# Patient Record
Sex: Female | Born: 1985 | Hispanic: No | Marital: Married | Smoking: Former smoker
Health system: Southern US, Community
[De-identification: ages and names within clinical notes are randomized; demographics above are authoritative.]

## PROBLEM LIST (undated history)

## (undated) DIAGNOSIS — M722 Plantar fascial fibromatosis: Secondary | ICD-10-CM

## (undated) DIAGNOSIS — D259 Leiomyoma of uterus, unspecified: Secondary | ICD-10-CM

## (undated) DIAGNOSIS — M25551 Pain in right hip: Secondary | ICD-10-CM

## (undated) DIAGNOSIS — M25562 Pain in left knee: Secondary | ICD-10-CM

## (undated) DIAGNOSIS — D649 Anemia, unspecified: Secondary | ICD-10-CM

## (undated) HISTORY — DX: Anemia, unspecified: D64.9

## (undated) HISTORY — PX: NO PAST SURGERIES: SHX2092

## (undated) HISTORY — DX: Pain in right hip: M25.551

## (undated) HISTORY — DX: Plantar fascial fibromatosis: M72.2

## (undated) HISTORY — DX: Pain in left knee: M25.562

---
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
## 2006-02-06 ENCOUNTER — Inpatient Hospital Stay (HOSPITAL_COMMUNITY): Admission: EM | Admit: 2006-02-06 | Discharge: 2006-02-08 | Payer: Self-pay | Admitting: Emergency Medicine

## 2009-05-31 ENCOUNTER — Emergency Department (HOSPITAL_COMMUNITY): Admission: EM | Admit: 2009-05-31 | Discharge: 2009-05-31 | Payer: Self-pay | Admitting: Emergency Medicine

## 2010-11-26 LAB — DIFFERENTIAL
Basophils Absolute: 0 10*3/uL (ref 0.0–0.1)
Basophils Relative: 0 % (ref 0–1)
Eosinophils Absolute: 0 10*3/uL (ref 0.0–0.7)
Eosinophils Relative: 0 % (ref 0–5)
Lymphocytes Relative: 21 % (ref 12–46)
Lymphs Abs: 1.4 10*3/uL (ref 0.7–4.0)
Monocytes Absolute: 0.8 10*3/uL (ref 0.1–1.0)
Monocytes Relative: 12 % (ref 3–12)
Neutro Abs: 4.2 10*3/uL (ref 1.7–7.7)
Neutrophils Relative %: 66 % (ref 43–77)

## 2010-11-26 LAB — CBC
HCT: 42.8 % (ref 36.0–46.0)
Hemoglobin: 14.2 g/dL (ref 12.0–15.0)
MCHC: 33.3 g/dL (ref 30.0–36.0)
MCV: 87.6 fL (ref 78.0–100.0)
Platelets: 177 10*3/uL (ref 150–400)
RBC: 4.89 MIL/uL (ref 3.87–5.11)
RDW: 13.2 % (ref 11.5–15.5)
WBC: 6.4 10*3/uL (ref 4.0–10.5)

## 2010-11-26 LAB — COMPREHENSIVE METABOLIC PANEL
ALT: 17 U/L (ref 0–35)
AST: 21 U/L (ref 0–37)
Albumin: 3.5 g/dL (ref 3.5–5.2)
Alkaline Phosphatase: 43 U/L (ref 39–117)
BUN: 17 mg/dL (ref 6–23)
CO2: 26 mEq/L (ref 19–32)
Calcium: 8.7 mg/dL (ref 8.4–10.5)
Chloride: 100 mEq/L (ref 96–112)
Creatinine, Ser: 1.02 mg/dL (ref 0.4–1.2)
GFR calc Af Amer: >60 mL/min (ref >60)
GFR calc non Af Amer: >60 mL/min (ref >60)
Glucose, Bld: 81 mg/dL (ref 70–99)
Potassium: 3.5 mEq/L (ref 3.5–5.1)
Sodium: 135 mEq/L (ref 135–145)
Total Bilirubin: 0.8 mg/dL (ref 0.3–1.2)
Total Protein: 8.8 g/dL — ABNORMAL HIGH (ref 6.0–8.3)

## 2010-11-26 LAB — RAPID STREP SCREEN (MED CTR MEBANE ONLY): Streptococcus, Group A Screen (Direct): NEGATIVE

## 2010-11-26 LAB — MONONUCLEOSIS SCREEN: Mono Screen: NEGATIVE

## 2011-01-08 NOTE — Discharge Summary (Signed)
NAMEMISHELLE, Kathleen NO.:  [id number]   MEDICAL RECORD NO.:  [id number]          PATIENT TYPE:  INP   LOCATION:  5508                         FACILITY:  [facility]   PHYSICIAN:  Jackie Plum, M.D.DATE OF BIRTH:  [birth year]   DATE OF ADMISSION:  02/06/2006  DATE OF DISCHARGE:  02/08/2006                                 DISCHARGE SUMMARY   DISCHARGE DIAGNOSES:  1. Viral syndrome.  2. Urticaria, possibly secondary to viral syndrome.  3. Nausea, vomiting and diarrhea secondary to viral syndrome, resolved.   DISCHARGE MEDICATIONS:  1. Pepcid 20 mg b.i.d.  2. Prednisone 20 mg b.i.d.  3. Benadryl as previously.   Patient was to followup with her PCP, Dr. Cliffton Asters, on the same day of  discharge.  Blood culture was pending and needed to be followed up.   Patient presented with a constellation of symptoms of fever, chills,  fatigue, myalgia with dyspnea, cough without sputum production, and  odynophagia.  She had some nausea and vomiting and diarrhea.  Patient was  seen and admitted.  Please see admission H&P dictated  on February 06, 2006, for  full details regarding patient's presentation.  She was placed on  antibiotics for presumed Pneumonia.  CT scan was done, which was negative  for a pneumonic process and antibiotics were discontinued.  Her hives  improved with steroids, Pepcid and Benadryl and was discharged in a stable  satisfactory condition by day number 3 of hospitalization.  Patient had  plans to see her PCP.  On the same day her group A strep was negative, and  so had been her urine culture C. diff as well as mono screen.   CONDITION ON DISCHARGE:  Stable and satisfactory condition.   CONSULTANTS:  Dr. Weldon Inches.      Jackie Plum, M.D.  Electronically Signed     GO/MEDQ  D:  03/24/2006  T:  03/24/2006  Job:  [id number]   cc:   Kathleen Joyce. Weldon Inches, MD

## 2011-01-08 NOTE — H&P (Signed)
NAME:  Kathleen Joyce, Kathleen Joyce               ACCOUNT NO.:  [id number]   MEDICAL RECORD NO.:  [id number]          PATIENT TYPE:  EMS   LOCATION:  [facility]                         FACILITY:  [facility]   PHYSICIAN:  Melissa L. Ladona Ridgel, MD  DATE OF BIRTH:  01-18-1986   DATE OF ADMISSION:  02/06/2006  DATE OF DISCHARGE:                                HISTORY & PHYSICAL   CHIEF COMPLAINT:  Hives and flu-like symptoms.   PRIMARY CARE PHYSICIAN:  Laurann Montana, M.D.   HISTORY OF PRESENT ILLNESS:  The patient is an 25 year old African-American  female who states that on Tuesday of last week she woke up in the morning  with flu-like symptoms. Her throat was sore and she had some hoarseness.  Later on in the day, she ate a bagel and thereafter, developed acute hives  all over her face, arms, back, as well as her legs. She went to see the  doctor and was given a cortisone shot and started on oral prednisone as well  as medications for nausea. After the cortisone shot around Friday, she  noticed that her heart was racing and she developed worsening symptoms of  shortness of breath, trouble swallowing secondary to pain, fever of 101.9  with chills. She started to have a cough with nausea and vomiting and  diarrhea. She had no sick contacts at home and is bringing up a yellow  sputum. Her mother brought her to the hospital today when she stated that  her chest was uncomfortable.   REVIEW OF SYSTEMS:  Positive diarrhea several times a day, decreased in  number today. Nausea with vomiting, decreased today. Some dysuria. The  patient had, as stated, contact with an everything bagel, which she has  never eaten before. Her mother relates that she never had any hives as a  child and no food allergies as a child.   PAST MEDICAL HISTORY:  No history of hives until this week. Her mother  relates that she had all of her childhood inoculations and recently, started  on her hepatitis series of inoculations in March for  college in the fall.  She had chicken pox as a child.   PAST SURGICAL HISTORY:  None.   ALLERGIES:  NO KNOWN DRUG ALLERGIES.   MEDICATIONS:  Promethazine and prednisone.   GYNECOLOGIC HISTORY:  Last menstrual period was 1 month ago. She states that  she is not sexually active.   PHYSICAL EXAMINATION:  VITAL SIGNS:  Temperature 101 when she arrived, down  to 100.9. Blood pressure 127/57, pulse 107/120. Saturation 93%.  GENERAL:  An ill appearing African-American female with dry skin and  evidence for resolving edema of her face and lips. She had a hoarse voice.  HEENT:  Normocephalic and atraumatic. Pupils are equal, round, and reactive  to light. Positive conjunctival infection bilaterally. Her TM's are clear.  Dry mucous membranes. Posterior pharynx is red with small petechiae on the  soft palate.  NECK:  Supple. There is no JVD and no lymph nodes. No carotid bruits. She  does have some tenderness in the left submandibular area but I do  not  palpate a huge lymph node there.  CHEST:  Decreased breath sounds bilaterally with no rhonchi, rales, or  wheezes.  CARDIOVASCULAR:  Tachycardiac, positive S1 and S2. No S3, S4, murmurs, rubs,  or gallops.  ABDOMEN:  Soft, nontender, and nondistended with positive bowel sounds.  There is no hepatosplenomegaly. She is slightly obese.  EXTREMITIES:  Fading hives on her back and face with some evidence for  residual edema.  NEUROLOGIC:  Alert, awake, and oriented times three. Cranial nerves 2-12 are  intact. Power is 5 over 5. Deep tendon reflexes are 2+. Plantar's are  downgoing.   LABORATORY DATA:  Trace leuko-esterase with 7 to 10 WBC's and white count of  7.8 with hemoglobin of 13.5, hematocrit 40.6, platelets of 177,000. Her  white cells shows an 83% neutrophilic shift. She has eosinophils of only 1%,  which is within normal limits. Sodium 136, potassium 3.3. Chloride is 100,  CO2 is 27, BUN is 12, creatinine 0.9. Hemoglobin is 110.  LFT's are within  normal limits. Her group A strep is negative. Blood cultures are pending.   Chest x-ray shows right greater than left pneumonia pattern.   ASSESSMENT/PLAN:  This is an 25 year old African-American female with  symptoms of hives that started after the symptoms of upper respiratory  infection last Tuesday. After receiving a Cortisone shot, the patient  progressed on to fever, nausea, vomiting, and diarrhea. She developed some  tachycardia and chest discomfort today with the finding of pneumonia  bilaterally on her chest x-ray.   #1.  CARDIOVASCULAR:  Tachycardia, likely multi-factorial with fever,  dehydration, and pneumonia. Will present telemetry to further monitor her.   #2.  PULMONARY:  Bilateral pneumonia, question bacterial versus viral. Will  treat her with antibiotics. Will check a monospot and rule out elevated EBV  titers. She will be placed on incentive spirometry and NSAID'S for her pain  and I will put her on a tapering dose of prednisone with an H2 blocker and  Benadryl.   #3.  GASTROINTESTINAL:  Nausea and vomiting. She will be started with Zofran  and clear liquids. Will treat her sore throat with Chloraseptic and I will  obtain stool cultures.   #4.  GENITOURINARY:  Possible urinary tract infection. Will send cultures.  The ceftriaxone at this time should cover her until we know the organism.   #5. ENDOCRINE:  No history of diabetes and her blood sugar is appropriate,  so I will put her on D5 normal with 20 of KCL. DVT prophylaxis will be with  __________ hose and ambulation.      Melissa L. Ladona Ridgel, MD     MLT/MEDQ  D:  02/06/2006  T:  02/07/2006  Job:  [id number]   cc:   [place]. Cliffton Asters, M.D.  Fax: [phone]

## 2015-06-06 ENCOUNTER — Encounter: Payer: Self-pay | Admitting: Obstetrics & Gynecology

## 2015-07-09 ENCOUNTER — Encounter: Payer: Self-pay | Admitting: Obstetrics & Gynecology

## 2018-05-03 ENCOUNTER — Encounter: Payer: Self-pay | Admitting: *Deleted

## 2018-05-05 ENCOUNTER — Ambulatory Visit: Payer: Self-pay | Admitting: Family Medicine

## 2018-05-05 DIAGNOSIS — Z0289 Encounter for other administrative examinations: Secondary | ICD-10-CM

## 2018-05-12 ENCOUNTER — Ambulatory Visit: Payer: 59 | Admitting: Family Medicine

## 2018-05-12 ENCOUNTER — Encounter: Payer: Self-pay | Admitting: Family Medicine

## 2018-05-12 VITALS — BP 136/60 | HR 61 | Temp 98.0°F | Resp 12 | Ht 70.0 in | Wt 276.2 lb

## 2018-05-12 DIAGNOSIS — Z131 Encounter for screening for diabetes mellitus: Secondary | ICD-10-CM | POA: Diagnosis not present

## 2018-05-12 DIAGNOSIS — M722 Plantar fascial fibromatosis: Secondary | ICD-10-CM

## 2018-05-12 DIAGNOSIS — N920 Excessive and frequent menstruation with regular cycle: Secondary | ICD-10-CM

## 2018-05-12 DIAGNOSIS — Z0000 Encounter for general adult medical examination without abnormal findings: Secondary | ICD-10-CM | POA: Diagnosis not present

## 2018-05-12 DIAGNOSIS — Z13220 Encounter for screening for lipoid disorders: Secondary | ICD-10-CM | POA: Diagnosis not present

## 2018-05-12 DIAGNOSIS — Z23 Encounter for immunization: Secondary | ICD-10-CM | POA: Diagnosis not present

## 2018-05-12 LAB — CBC
HCT: 37.3 % (ref 36.0–46.0)
Hemoglobin: 12.1 g/dL (ref 12.0–15.0)
MCHC: 32.4 g/dL (ref 30.0–36.0)
MCV: 81 fl (ref 78.0–100.0)
Platelets: 215 10*3/uL (ref 150.0–400.0)
RBC: 4.61 Mil/uL (ref 3.87–5.11)
RDW: 16.3 % — ABNORMAL HIGH (ref 11.5–15.5)
WBC: 6.3 10*3/uL (ref 4.0–10.5)

## 2018-05-12 LAB — BASIC METABOLIC PANEL
BUN: 14 mg/dL (ref 6–23)
CO2: 26 mEq/L (ref 19–32)
Calcium: 9.5 mg/dL (ref 8.4–10.5)
Chloride: 105 mEq/L (ref 96–112)
Creatinine, Ser: 0.89 mg/dL (ref 0.40–1.20)
GFR: 78.73 mL/min (ref >60.00)
Glucose, Bld: 91 mg/dL (ref 70–99)
Potassium: 4.2 mEq/L (ref 3.5–5.1)
Sodium: 138 mEq/L (ref 135–145)

## 2018-05-12 LAB — LIPID PANEL
Cholesterol: 177 mg/dL (ref 0–200)
HDL: 44.7 mg/dL (ref >39.00)
LDL Cholesterol: 118 mg/dL — ABNORMAL HIGH (ref 0–99)
NonHDL: 132.41
Total CHOL/HDL Ratio: 4
Triglycerides: 73 mg/dL (ref 0.0–149.0)
VLDL: 14.6 mg/dL (ref 0.0–40.0)

## 2018-05-12 NOTE — Progress Notes (Signed)
HPI:   [name] is a 32 y.o. female, who is here today to establish care.  Former PCP: She had a PCP in [place] Last preventive routine visit: About a year ago.   Chronic medical problems: Bilateral plantar fasciitis, she was following with podiatrist in [place]. She is also reporting history of "borderline anemia", due to heavy menses.  Denies history of diabetes, hypertension, hyperlipidemia.  Concerns today: She needs a biometric and complete physical. She is also requesting screening for lupus, her mother has Hx of systemic lupus.  Last pap smear in 2017. Denies Hx of abnormal pap smear. M: 12 G:0 She is not sexually active.  She is planning on establishing with gyn in this area.   She is also reporting some symptoms she had about 2 months ago,resolved and she attributed to stress. She developed hives,arthralgias and mild numbness on LE's. She had symptoms for a few days, no OTC treatment, symptoms resolved spontaneously. No fever,chills,sore throat, cough,dyspnea,chest pain,abdominal pain,N/V,abdominal pain,or skin rash.  She lives with her mother.   Since May this year she has been exercising regularly, 4 times per week, and following a healthy diet. LMP 04/29/18, Hx of heavy menses,sometimes with clots.    Review of Systems  Constitutional: Negative for appetite change, fatigue and fever.  HENT: Negative for dental problem, hearing loss, mouth sores, sore throat, trouble swallowing and voice change.   Eyes: Negative for redness and visual disturbance.  Respiratory: Negative for cough, shortness of breath and wheezing.   Cardiovascular: Negative for chest pain, palpitations and leg swelling.  Gastrointestinal: Negative for abdominal pain, nausea and vomiting.       No changes in bowel habits.  Endocrine: Negative for cold intolerance, heat intolerance, polydipsia, polyphagia and polyuria.  Genitourinary: Negative for decreased urine volume,  dysuria, hematuria, vaginal bleeding and vaginal discharge.  Musculoskeletal: Negative for gait problem, joint swelling and myalgias.  Skin: Negative for color change and rash.  Allergic/Immunologic: Negative for environmental allergies.  Neurological: Negative for syncope and weakness.  Hematological: Negative for adenopathy. Does not bruise/bleed easily.  Psychiatric/Behavioral: Negative for confusion and sleep disturbance. The patient is not nervous/anxious.   All other systems reviewed and are negative.   No current outpatient medications on file prior to visit.   No current facility-administered medications on file prior to visit.      Past Medical History:  Diagnosis Date  . Anemia   . Plantar fasciitis    Allergies  Allergen Reactions  . Banana     Family History  Problem Relation Age of Onset  . Lupus Mother   . Kidney disease Mother   . Diverticulitis Father   . Diabetes Maternal Aunt   . Cancer Maternal Aunt   . Cancer Maternal Uncle     Social History   Socioeconomic History  . Marital status: Single    Spouse name: Not on file  . Number of children: 0  . Years of education: Not on file  . Highest education level: Not on file  Occupational History  . Not on file  Social Needs  . Financial resource strain: Not on file  . Food insecurity:    Worry: Not on file    Inability: Not on file  . Transportation needs:    Medical: Not on file    Non-medical: Not on file  Tobacco Use  . Smoking status: Former Research scientist (life sciences)  . Smokeless tobacco: Never Used  Substance and Sexual Activity  .  Alcohol use: Yes  . Drug use: Not Currently  . Sexual activity: Not Currently  Lifestyle  . Physical activity:    Days per week: Not on file    Minutes per session: Not on file  . Stress: Not on file  Relationships  . Social connections:    Talks on phone: Not on file    Gets together: Not on file    Attends religious service: Not on file    Active member of club or  organization: Not on file    Attends meetings of clubs or organizations: Not on file    Relationship status: Not on file  Other Topics Concern  . Not on file  Social History Narrative  . Not on file    Vitals:   05/12/18 0813  BP: 136/60  Pulse: 61  Resp: 12  Temp: 98 F (36.7 C)  SpO2: 98%    Body mass index is 39.63 kg/m.   Physical Exam  Nursing note and vitals reviewed. Constitutional: She is oriented to person, place, and time. She appears well-developed. No distress.  HENT:  Head: Normocephalic and atraumatic.  Right Ear: Hearing, tympanic membrane, external ear and ear canal normal.  Left Ear: Hearing, tympanic membrane, external ear and ear canal normal.  Mouth/Throat: Uvula is midline, oropharynx is clear and moist and mucous membranes are normal.  Eyes: Pupils are equal, round, and reactive to light. Conjunctivae and EOM are normal.  Neck: No tracheal deviation present. No thyromegaly present.  Cardiovascular: Normal rate and regular rhythm.  No murmur heard. Pulses:      Dorsalis pedis pulses are 2+ on the right side, and 2+ on the left side.  Respiratory: Effort normal and breath sounds normal. No respiratory distress.  GI: Soft. She exhibits no mass. There is no hepatomegaly. There is no tenderness.  Genitourinary:  Genitourinary Comments: Deferred to gyn.  Musculoskeletal: She exhibits no edema.  No major deformity or signs of synovitis appreciated.  Lymphadenopathy:    She has no cervical adenopathy.       Right: No supraclavicular adenopathy present.       Left: No supraclavicular adenopathy present.  Neurological: She is alert and oriented to person, place, and time. She has normal strength. No cranial nerve deficit. Coordination and gait normal.  Reflex Scores:      Bicep reflexes are 2+ on the right side and 2+ on the left side.      Patellar reflexes are 2+ on the right side and 2+ on the left side. Skin: Skin is warm. No rash noted. No erythema.   Psychiatric: She has a normal mood and affect.  Well groomed, good eye contact.      ASSESSMENT AND PLAN:   Ms. Kathleen Joyce was seen today for establish care and annual exam.  Orders Placed This Encounter  Procedures  . Flu Vaccine QUAD 36+ mos IM  . Basic metabolic panel  . CBC  . Lipid panel   Lab Results  Component Value Date   WBC 6.3 05/12/2018   HGB 12.1 05/12/2018   HCT 37.3 05/12/2018   MCV 81.0 05/12/2018   PLT 215.0 05/12/2018   Lab Results  Component Value Date   CHOL 177 05/12/2018   HDL 44.70 05/12/2018   LDLCALC 118 (H) 05/12/2018   TRIG 73.0 05/12/2018   CHOLHDL 4 05/12/2018   Lab Results  Component Value Date   CREATININE 0.89 05/12/2018   BUN 14 05/12/2018   NA 138 05/12/2018  K 4.2 05/12/2018   CL 105 05/12/2018   CO2 26 05/12/2018     Routine general medical examination at a health care facility  We discussed the importance of regular physical activity and healthy diet for prevention of chronic illness and/or complications. Preventive guidelines reviewed. Vaccination reported as up to date. Influenza vaccine today. She will continue female preventive care with gyn. She will drop form off to be completed. Next CPE in a year.  Screening for lipid disorders -     Lipid panel  Diabetes mellitus screening -     Basic metabolic panel  Menorrhagia with regular cycle -     CBC  Plantar fasciitis, bilateral  She has tried different treatments,recommend establishing with podiatrist in the are. Night foot splint.  Need for immunization against influenza -     Flu Vaccine QUAD 36+ mos IM   In regard to her concerned for lupus "screening', educated Dx criteria and symptoms. Since she is not symptomatic ,I recommend holding on work up.    Kathleen Joyce G. [place], MD  [facility]. [place] office.

## 2018-05-12 NOTE — Patient Instructions (Addendum)
A few things to remember from today's visit:   Routine general medical examination at a health care facility  Screening for lipid disorders - Plan: Lipid panel  Diabetes mellitus screening - Plan: Basic metabolic panel  Menorrhagia with regular cycle - Plan: CBC  Plantar fasciitis, bilateral  Today you have you routine preventive visit.  Given a referral to establish with gynecologist or podiatrist.   At least 150 minutes of moderate exercise per week, daily brisk walking for 15-30 min is a good exercise option. Healthy diet low in saturated (animal) fats and sweets and consisting of fresh fruits and vegetables, lean meats such as fish and white chicken and whole grains.  These are some of recommendations for screening depending of age and risk factors:   - Vaccines:  Tdap vaccine every 10 years.  Shingles vaccine recommended at age 55, could be given after 31 years of age but not sure about insurance coverage.   Pneumonia vaccines:  Prevnar 13 at 65 and Pneumovax at 62. Sometimes Pneumovax is giving earlier if history of smoking, lung disease,diabetes,kidney disease among some.    Screening for diabetes at age 72 and every 3 years.  Cervical cancer prevention:  Pap smear starts at 31 years of age and continues periodically until 31 years old in low risk women. Pap smear every 3 years between 44 and 92 years old. Pap smear every 3-5 years between women 72 and older if pap smear negative and HPV screening negative.   -Breast cancer: Mammogram: There is disagreement between experts about when to start screening in low risk asymptomatic female but recent recommendations are to start screening at 15 and not later than 31 years old , every 1-2 years and after 31 yo q 2 years. Screening is recommended until 31 years old but some women can continue screening depending of healthy issues.   Colon cancer screening: starts at 31 years old until 31 years old.  Cholesterol disorder  screening at age 4 and every 3 years.  Also recommended:  1. Dental visit- Brush and floss your teeth twice daily; visit your dentist twice a year. 2. Eye doctor- Get an eye exam at least every 2 years. 3. Helmet use- Always wear a helmet when riding a bicycle, motorcycle, rollerblading or skateboarding. 4. Safe sex- If you may be exposed to sexually transmitted infections, use a condom. 5. Seat belts- Seat belts can save your live; always wear one. 6. Smoke/Carbon Monoxide detectors- These detectors need to be installed on the appropriate level of your home. Replace batteries at least once a year. 7. Skin cancer- When out in the sun please cover up and use sunscreen 15 SPF or higher. 8. Violence- If anyone is threatening or hurting you, please tell your healthcare provider.  9. Drink alcohol in moderation- Limit alcohol intake to one drink or less per day. Never drink and drive.   Please be sure medication list is accurate. If a new problem present, please set up appointment sooner than planned today.

## 2018-07-11 ENCOUNTER — Ambulatory Visit: Payer: Self-pay | Admitting: Gynecology

## 2018-07-13 ENCOUNTER — Encounter: Payer: Self-pay | Admitting: Obstetrics & Gynecology

## 2018-07-13 ENCOUNTER — Ambulatory Visit (INDEPENDENT_AMBULATORY_CARE_PROVIDER_SITE_OTHER): Payer: 59 | Admitting: Obstetrics & Gynecology

## 2018-07-13 ENCOUNTER — Telehealth: Payer: Self-pay | Admitting: *Deleted

## 2018-07-13 VITALS — BP 126/74 | Ht 70.0 in | Wt 274.0 lb

## 2018-07-13 DIAGNOSIS — E6609 Other obesity due to excess calories: Secondary | ICD-10-CM

## 2018-07-13 DIAGNOSIS — N6310 Unspecified lump in the right breast, unspecified quadrant: Secondary | ICD-10-CM

## 2018-07-13 DIAGNOSIS — Z6839 Body mass index (BMI) 39.0-39.9, adult: Secondary | ICD-10-CM

## 2018-07-13 DIAGNOSIS — Z113 Encounter for screening for infections with a predominantly sexual mode of transmission: Secondary | ICD-10-CM | POA: Diagnosis not present

## 2018-07-13 DIAGNOSIS — Z1151 Encounter for screening for human papillomavirus (HPV): Secondary | ICD-10-CM

## 2018-07-13 DIAGNOSIS — Z01419 Encounter for gynecological examination (general) (routine) without abnormal findings: Secondary | ICD-10-CM | POA: Diagnosis not present

## 2018-07-13 NOTE — Patient Instructions (Signed)
1. Encounter for routine gynecological examination with Papanicolaou smear of cervix Normal gynecologic exam.  Pap with high-risk HPV done today.  Left breast exam normal.  Right breast exam with a nodular/cystic pattern at 4 to 5 O'clock.  Fasting health labs with family physician.  2. Screen for STD (sexually transmitted disease) Currently abstinent.  Recommend strict condom use if becomes sexually active again. -Gonorrhea and chlamydia on Pap - HIV antibody (with reflex) - RPR - Hepatitis C Antibody - Hepatitis B Surface AntiGEN  3. Lump of right breast Probable fibrocystic breast disease at 4-5 o'clock on the right breast.  We will proceed with a right diagnostic mammogram and ultrasound.  Recommend to decrease caffeine products.  4. Class 2 obesity due to excess calories without serious comorbidity with body mass index (BMI) of 39.0 to 39.9 in adult Continue with low calorie/low carb diet.  [employer] suggested.  Recommend aerobic physical activity 5 times a week and weightlifting every 2 days.  Johnna, it was a pleasure meeting you today!  I will inform you of your results as soon as they are available.

## 2018-07-13 NOTE — Telephone Encounter (Signed)
Orders placed at the breast center they will call with time and date

## 2018-07-13 NOTE — Telephone Encounter (Signed)
-----   Message from Princess Bruins, MD sent at 07/13/2018  9:56 AM EST ----- Regarding: Refer for Rt Dx Mammo/US Rt breast lumps/tenderness at 4-5 O'clock on outside.

## 2018-07-13 NOTE — Progress Notes (Signed)
Kathleen Joyce [birth year] [id number]   History:    32 y.o. G0 Single  RP:  New patient presenting for annual gyn exam   HPI: Abstinent x >1 year.  Full STI screen/Pap test negative in 06/2015.  Loosing weight with a low calorie/carb diet and increased physical activity.  Per patient, lost >30 Lbs in the last year.  BMI 39.31 today.  Since loosing weight, her menstrual periods became regular normal every month.  No pelvic pain.  Normal vaginal secretions.  Lt breast normal.  Noticed tender lumps x a few weeks at the inner/lower Rt breast.  No first degree relative with Breast Ca.  Health Labs with Fam MD.  Past medical history,surgical history, family history and social history were all reviewed and documented in the EPIC chart.  Gynecologic History Patient's last menstrual period was 06/28/2018. Contraception: abstinence Last Pap: 06/2015. Results were: Negative Last mammogram: Never Bone Density: Never Colonoscopy: Never  Obstetric History OB History  Gravida Para Term Preterm AB Living  0 0 0 0 0 0  SAB TAB Ectopic Multiple Live Births  0 0 0 0 0     ROS: A ROS was performed and pertinent positives and negatives are included in the history.  GENERAL: No fevers or chills. HEENT: No change in vision, no earache, sore throat or sinus congestion. NECK: No pain or stiffness. CARDIOVASCULAR: No chest pain or pressure. No palpitations. PULMONARY: No shortness of breath, cough or wheeze. GASTROINTESTINAL: No abdominal pain, nausea, vomiting or diarrhea, melena or bright red blood per rectum. GENITOURINARY: No urinary frequency, urgency, hesitancy or dysuria. MUSCULOSKELETAL: No joint or muscle pain, no back pain, no recent trauma. DERMATOLOGIC: No rash, no itching, no lesions. ENDOCRINE: No polyuria, polydipsia, no heat or cold intolerance. No recent change in weight. HEMATOLOGICAL: No anemia or easy bruising or bleeding. NEUROLOGIC: No headache, seizures, numbness, tingling or weakness.  PSYCHIATRIC: No depression, no loss of interest in normal activity or change in sleep pattern.     Exam:   BP 126/74   Ht 5\' 10"  (1.778 m)   Wt 274 lb (124.3 kg)   LMP 06/28/2018 Comment: no birth control  BMI 39.31 kg/m   Body mass index is 39.31 kg/m.  General appearance : Well developed well nourished female. No acute distress HEENT: Eyes: no retinal hemorrhage or exudates,  Neck supple, trachea midline, no carotid bruits, no thyroidmegaly Lungs: Clear to auscultation, no rhonchi or wheezes, or rib retractions  Heart: Regular rate and rhythm, no murmurs or gallops Breast:Examined in sitting and supine position were symmetrical in appearance, no palpable masses or tenderness,  no skin retraction, no nipple inversion, no nipple discharge, no skin discoloration, no axillary or supraclavicular lymphadenopathy Abdomen: no palpable masses or tenderness, no rebound or guarding Extremities: no edema or skin discoloration or tenderness  Pelvic: Vulva: Normal             Vagina: No gross lesions or discharge  Cervix: No gross lesions or discharge.  Pap/HPV HR/Gono-Chlam done  Uterus  AV, normal size, shape and consistency, non-tender and mobile  Adnexa  Without masses or tenderness  Anus: Normal   Assessment/Plan:  32 y.o. female for annual exam   1. Encounter for routine gynecological examination with Papanicolaou smear of cervix Normal gynecologic exam.  Pap with high-risk HPV done today.  Left breast exam normal.  Right breast exam with a nodular/cystic pattern at 4 to 5 O'clock.  Fasting health labs with family physician.  2.  Screen for STD (sexually transmitted disease) Currently abstinent.  Recommend strict condom use if becomes sexually active again. -Gonorrhea and chlamydia on Pap - HIV antibody (with reflex) - RPR - Hepatitis C Antibody - Hepatitis B Surface AntiGEN  3. Lump of right breast Probable fibrocystic breast disease at 4-5 o'clock on the right breast.  We  will proceed with a right diagnostic mammogram and ultrasound.  Recommend to decrease caffeine products.  4. Class 2 obesity due to excess calories without serious comorbidity with body mass index (BMI) of 39.0 to 39.9 in adult Continue with low calorie/low carb diet.  [employer] suggested.  Recommend aerobic physical activity 5 times a week and weightlifting every 2 days.  Counseling on above issues and coordination of care more than 50% for 20 minutes.  Princess Bruins MD, 9:36 AM 07/13/2018

## 2018-07-13 NOTE — Addendum Note (Signed)
Addended by: Thurnell Garbe A on: 07/13/2018 11:21 AM   Modules accepted: Orders

## 2018-07-14 LAB — HIV ANTIBODY (ROUTINE TESTING W REFLEX): HIV 1&2 Ab, 4th Generation: NONREACTIVE

## 2018-07-14 LAB — RPR: RPR Ser Ql: NONREACTIVE

## 2018-07-14 LAB — HEPATITIS C ANTIBODY
Hepatitis C Ab: NONREACTIVE
SIGNAL TO CUT-OFF: 0.02 (ref <1.00)

## 2018-07-14 LAB — HEPATITIS B SURFACE ANTIGEN: Hepatitis B Surface Ag: NONREACTIVE

## 2018-07-14 NOTE — Telephone Encounter (Signed)
Appointment on 07/19/18 @ 7:40am

## 2018-07-15 LAB — PAP IG, CT-NG NAA, HPV HIGH-RISK
C. trachomatis RNA, TMA: NOT DETECTED
HPV DNA High Risk: NOT DETECTED
N. gonorrhoeae RNA, TMA: NOT DETECTED

## 2018-07-19 ENCOUNTER — Ambulatory Visit
Admission: RE | Admit: 2018-07-19 | Discharge: 2018-07-19 | Disposition: A | Discharge status: Home / Self Care | Payer: 59 | Source: Ambulatory Visit | Attending: Obstetrics & Gynecology | Admitting: Obstetrics & Gynecology

## 2018-07-19 DIAGNOSIS — N6310 Unspecified lump in the right breast, unspecified quadrant: Secondary | ICD-10-CM

## 2018-08-25 ENCOUNTER — Ambulatory Visit: Payer: Self-pay | Admitting: Obstetrics & Gynecology

## 2019-01-26 DIAGNOSIS — Z202 Contact with and (suspected) exposure to infections with a predominantly sexual mode of transmission: Secondary | ICD-10-CM | POA: Diagnosis not present

## 2019-01-26 DIAGNOSIS — Z114 Encounter for screening for human immunodeficiency virus [HIV]: Secondary | ICD-10-CM | POA: Diagnosis not present

## 2019-01-26 DIAGNOSIS — Z113 Encounter for screening for infections with a predominantly sexual mode of transmission: Secondary | ICD-10-CM | POA: Diagnosis not present

## 2019-03-15 DIAGNOSIS — Z20828 Contact with and (suspected) exposure to other viral communicable diseases: Secondary | ICD-10-CM | POA: Diagnosis not present

## 2019-04-24 DIAGNOSIS — Z20828 Contact with and (suspected) exposure to other viral communicable diseases: Secondary | ICD-10-CM | POA: Diagnosis not present

## 2019-05-14 ENCOUNTER — Encounter: Payer: Self-pay | Admitting: Gynecology

## 2019-05-28 DIAGNOSIS — Z20828 Contact with and (suspected) exposure to other viral communicable diseases: Secondary | ICD-10-CM | POA: Diagnosis not present

## 2019-06-11 NOTE — Patient Instructions (Addendum)
Health Maintenance Due  Topic Date Due  . TETANUS/TDAP  07/25/2006  . INFLUENZA VACCINE  03/24/2019    Depression screen PHQ 2/9 05/14/2018  Decreased Interest 0  Down, Depressed, Hopeless 0  PHQ - 2 Score 0  A few things to remember from today's visit:   Left knee pain, unspecified chronicity - Plan: Ambulatory referral to Physical Therapy  Morbid obesity with BMI of 40.0-44.9, adult ([place]) - Plan: Amb Ref to Medical Weight Management  Headache, unspecified headache type  Upper back pain  Try not to skip lunch, you can try a protein smoothie. Count calories, recommend 1500 cal/day. Low impact exercise consistently.  Keep appointment with chiropractor today.  Please be sure medication list is accurate. If a new problem present, please set up appointment sooner than planned today.

## 2019-06-12 ENCOUNTER — Encounter: Payer: Self-pay | Admitting: Family Medicine

## 2019-06-12 ENCOUNTER — Other Ambulatory Visit: Payer: Self-pay

## 2019-06-12 ENCOUNTER — Ambulatory Visit: Payer: BC Managed Care – PPO | Admitting: Family Medicine

## 2019-06-12 VITALS — BP 138/62 | HR 78 | Temp 98.1°F | Resp 16 | Ht 70.0 in | Wt 287.4 lb

## 2019-06-12 DIAGNOSIS — R519 Headache, unspecified: Secondary | ICD-10-CM

## 2019-06-12 DIAGNOSIS — M9901 Segmental and somatic dysfunction of cervical region: Secondary | ICD-10-CM | POA: Diagnosis not present

## 2019-06-12 DIAGNOSIS — M9902 Segmental and somatic dysfunction of thoracic region: Secondary | ICD-10-CM | POA: Diagnosis not present

## 2019-06-12 DIAGNOSIS — Z6841 Body Mass Index (BMI) 40.0 and over, adult: Secondary | ICD-10-CM | POA: Insufficient documentation

## 2019-06-12 DIAGNOSIS — M25562 Pain in left knee: Secondary | ICD-10-CM | POA: Diagnosis not present

## 2019-06-12 DIAGNOSIS — M549 Dorsalgia, unspecified: Secondary | ICD-10-CM | POA: Diagnosis not present

## 2019-06-12 DIAGNOSIS — M9903 Segmental and somatic dysfunction of lumbar region: Secondary | ICD-10-CM | POA: Diagnosis not present

## 2019-06-12 DIAGNOSIS — M9905 Segmental and somatic dysfunction of pelvic region: Secondary | ICD-10-CM | POA: Diagnosis not present

## 2019-06-12 NOTE — Progress Notes (Signed)
Chief Complaint  Patient presents with  . Headache    Pt stated sx started a month ago. Pt claims she has been sleeping wrong due to sleeping on couches in hospital.   . Shoulder Pain  . Weight Check    Pt wants a referral for weight management. Pt claim that weight could be the cause of her joint pain.     HPI:  Kathleen Joyce is a 33 y.o. female, who is here today with above concerns.  She has had occipital and parietal headache sine 04/24/19 after spending nights in the hospital with her mother, sleeping on the couch.She felt like she "cracked" her neck. Occasionally she feels lightheaded.  She does not wake up with headache,it starts later during the day. She denies Hx of trauma. Pressure like sensation, constant, max 8/10 but most of the time 4/10.  Denies associated visual changes, N/V,or focal neurologic deficit. Sometimes she has photophobia.  Hx of migraines, headache she is having now is "very different."  Upper back pain, in between shoulder blades. Negative for fever,chills,sore throat,cough, wheezing,or SOB.  Advil helps.  Problem has been stable. She has an appt with chiropractor today.  Left knee pain for the past 2 days. She felt knee "crack" when she was moving knee. She had left knee injured before. No edema or erythema and no significant limitation of ROM.  Pain is exacerbated by walking and alleviated by rest and by wearing knee brace. She feels like knee is unstable.   Obesity: She is asking to be referred to wt management. She does not think she is eating a lot. She does not exercise regularly.  She is trying to eat healthier sine 11/2018 but has not noted wt loss. Trying not to eat after 8 pm and drinking just water or coffee. She drinks 3 cups of coffee with cream daily.  Breakfast: Slices of peach and 2 boil eggs. Lunch: Skips it due to work schedule. Dinner: " A lot of fish", 2 fillets of fish,vegetables , beans or potatoes.    Review of Systems  Constitutional: Negative for activity change, appetite change and fatigue.  HENT: Negative for mouth sores, nosebleeds and trouble swallowing.   Eyes: Negative for redness and visual disturbance.  Cardiovascular: Negative for chest pain, palpitations and leg swelling.  Gastrointestinal: Negative for abdominal pain.       Negative for changes in bowel habits.  Endocrine: Negative for cold intolerance and heat intolerance.  Genitourinary: Negative for decreased urine volume and hematuria.  Musculoskeletal: Positive for arthralgias and gait problem.  Allergic/Immunologic: Negative for environmental allergies.  Neurological: Negative for syncope, facial asymmetry and numbness.  Hematological: Negative for adenopathy. Does not bruise/bleed easily.  Rest see pertinent positives and negatives per HPI.   No current outpatient medications on file prior to visit.   No current facility-administered medications on file prior to visit.     Past Medical History:  Diagnosis Date  . Anemia   . Plantar fasciitis    Allergies  Allergen Reactions  . Banana     Social History   Socioeconomic History  . Marital status: Single    Spouse name: Not on file  . Number of children: 0  . Years of education: Not on file  . Highest education level: Not on file  Occupational History  . Not on file  Social Needs  . Financial resource strain: Not on file  . Food insecurity    Worry: Not on  file    Inability: Not on file  . Transportation needs    Medical: Not on file    Non-medical: Not on file  Tobacco Use  . Smoking status: Former Research scientist (life sciences)  . Smokeless tobacco: Never Used  Substance and Sexual Activity  . Alcohol use: Yes    Comment: rare  . Drug use: Not Currently  . Sexual activity: Not Currently    Comment: 1st intercourse- 20, partners- 28,   Lifestyle  . Physical activity    Days per week: Not on file    Minutes per session: Not on file  . Stress: Not on file   Relationships  . Social Herbalist on phone: Not on file    Gets together: Not on file    Attends religious service: Not on file    Active member of club or organization: Not on file    Attends meetings of clubs or organizations: Not on file    Relationship status: Not on file  Other Topics Concern  . Not on file  Social History Narrative  . Not on file    Vitals:   06/12/19 0815  BP: 138/62  Pulse: 78  Resp: 16  Temp: 98.1 F (36.7 C)  SpO2: 98%   Body mass index is 41.24 kg/m.  Wt Readings from Last 3 Encounters:  06/12/19 287 lb 6.4 oz (130.4 kg)  07/13/18 274 lb (124.3 kg)  05/12/18 276 lb 3.2 oz (125.3 kg)    Physical Exam  Nursing note and vitals reviewed. Constitutional: She is oriented to person, place, and time. She appears well-developed. No distress.  HENT:  Head: Normocephalic and atraumatic.  Mouth/Throat: Oropharynx is clear and moist and mucous membranes are normal.  Eyes: Pupils are equal, round, and reactive to light. Conjunctivae are normal.  Cardiovascular: Normal rate and regular rhythm.  No murmur heard. Pulses:      Dorsalis pedis pulses are 2+ on the right side and 2+ on the left side.  Respiratory: Effort normal and breath sounds normal. No respiratory distress.  GI: Soft. She exhibits no mass. There is no hepatomegaly. There is no abdominal tenderness.  Musculoskeletal:        General: No edema.     Left knee: She exhibits normal range of motion, no effusion, no deformity and no erythema.     Cervical back: She exhibits tenderness.     Thoracic back: She exhibits no tenderness and no bony tenderness.       Back:     Comments: Knee: on inspection no effusion, erythema, or deformities. Valgus and varus stress normal, McMurray negative (meniscus), anterior and posterior drawer test negative. Patellar apprehension test negative.  Lymphadenopathy:    She has no cervical adenopathy.  Neurological: She is alert and oriented to  person, place, and time. She has normal strength. No cranial nerve deficit. Gait normal.  Skin: Skin is warm. No rash noted. No erythema.  Psychiatric: She has a normal mood and affect.  Well groomed, good eye contact.    ASSESSMENT AND PLAN:  Kathleen Joyce was seen today for headache, shoulder pain and weight check.  Diagnoses and all orders for this visit:  Left knee pain, unspecified chronicity Wt loss will help. Fall precautions discussed. Continue Ibuprofen 400 mg tid as needed with food, 7-10 days. Ortho referral placed.  -     Ambulatory referral to Physical Therapy  Morbid obesity with BMI of 40.0-44.9, adult ([place]) We discussed benefits of wt loss  as well as adverse effects of obesity. Consistency with healthy diet and physical activity recommended. We discussed some dietary options. Recommend a protein smoothie for lunch. 1/2 cup of potatoes or beans at dinner and small portion of protein, 1 fillet instead 2. We also discussed natural Hx of obesity.  -     Amb Ref to Medical Weight Management  Headache, unspecified headache type ? Tension headache. Keep appt with chiropractor. Instructed about warning signs.  Upper back pain Local massage,ice.heat may help. Topical Icy hot may also help. Chiropractor appt today.   Return if symptoms worsen or fail to improve.    Betty G. Martinique, MD  [facility]. [place] office.

## 2019-06-14 DIAGNOSIS — Z20828 Contact with and (suspected) exposure to other viral communicable diseases: Secondary | ICD-10-CM | POA: Diagnosis not present

## 2019-06-15 DIAGNOSIS — F321 Major depressive disorder, single episode, moderate: Secondary | ICD-10-CM | POA: Diagnosis not present

## 2019-06-15 DIAGNOSIS — F411 Generalized anxiety disorder: Secondary | ICD-10-CM | POA: Diagnosis not present

## 2019-06-18 DIAGNOSIS — F321 Major depressive disorder, single episode, moderate: Secondary | ICD-10-CM | POA: Diagnosis not present

## 2019-06-18 DIAGNOSIS — F411 Generalized anxiety disorder: Secondary | ICD-10-CM | POA: Diagnosis not present

## 2019-06-25 ENCOUNTER — Other Ambulatory Visit: Payer: Self-pay

## 2019-06-25 ENCOUNTER — Ambulatory Visit: Payer: BC Managed Care – PPO | Attending: Family Medicine | Admitting: Physical Therapy

## 2019-06-25 ENCOUNTER — Encounter: Payer: Self-pay | Admitting: Physical Therapy

## 2019-06-25 DIAGNOSIS — M6281 Muscle weakness (generalized): Secondary | ICD-10-CM | POA: Insufficient documentation

## 2019-06-25 DIAGNOSIS — M25562 Pain in left knee: Secondary | ICD-10-CM | POA: Insufficient documentation

## 2019-06-25 DIAGNOSIS — F321 Major depressive disorder, single episode, moderate: Secondary | ICD-10-CM | POA: Diagnosis not present

## 2019-06-25 DIAGNOSIS — F411 Generalized anxiety disorder: Secondary | ICD-10-CM | POA: Diagnosis not present

## 2019-06-25 NOTE — Therapy (Signed)
[facility] [address] [place], [place], [address] Phone: [phone]   Fax:  [phone]  Physical Therapy Evaluation  Patient Details  Name: Kathleen Joyce MRN: [id number] Date of Birth: 09/16/1985 Referring Provider (PT): Martinique, Betty G, MD   Encounter Date: 06/25/2019  PT End of Session - 06/25/19 1202    Visit Number  1    Date for PT Re-Evaluation  08/20/19    Authorization Type  [employer]    PT Start Time  1015    PT Stop Time  1100    PT Time Calculation (min)  45 min    Activity Tolerance  Patient tolerated treatment well    Behavior During Therapy  [facility] for tasks assessed/performed       Past Medical History:  Diagnosis Date  . Anemia   . Plantar fasciitis     History reviewed. No pertinent surgical history.  There were no vitals filed for this visit.   Subjective Assessment - 06/25/19 1022    Subjective  Lt knee pain beginning approx 2-3 weeks ago, felt a crack when sitting back on foot tucked under pelvis at weird angle, slowly straightened it out and felt it "relocate" but continued pain.  Pain is improving over time.  Pt has had Lt knee injury in past with patellar dislocation/relocation.  Knee feels unstable but not as bad now as compared to when first started.  Used knee sleeve but not wearing one anymore.  Pt history of "double jointedness".  Now Rt low back hurts from working around the Lt knee.    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  1 hour    How long can you stand comfortably?  unlimited    How long can you walk comfortably?  unlimited    Diagnostic tests  no    Patient Stated Goals  get knee stronger    Currently in Pain?  No/denies         [facility] PT Assessment - 06/25/19 0001      Assessment   Medical Diagnosis  M25.562 (ICD-10-CM) - Left knee pain, unspecified  chronicity    Referring Provider (PT)  Martinique, Betty G, MD    Onset Date/Surgical Date  --   approx 2-3 weeks ago, with history of prior injury 2 years a   Next MD Visit  no    Prior Therapy  yes      Precautions   Precautions  Other (comment)    Precaution Comments  "double jointed" with history of Lt patellar disloc      Restrictions   Weight Bearing Restrictions  No      Balance Screen   Has the patient fallen in the past 6 months  No      [place] residence    Home Layout  Two level    Alternate Level Stairs-Number of Steps  15   no difficulty with stairs     Prior Function   Level of Independence  Independent    Vocation  Full time employment    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    Leisure  e-scooters      Cognition   Overall Cognitive Status  Within Functional Limits for tasks assessed      Observation/Other Assessments   Observations  genu recurvatum  Focus on Therapeutic Outcomes (FOTO)   39%   23% goal     Posture/Postural Control   Posture Comments  genu recurvatum but Pt able to control to neutral      ROM / Strength   AROM / PROM / Strength  AROM;Strength      AROM   Overall AROM Comments  no pain with ROM    AROM Assessment Site  Knee    Right/Left Knee  Right;Left    Right Knee Extension  -5   hypermobile   Right Knee Flexion  140    Left Knee Extension  -3   hypermobile   Left Knee Flexion  140      Strength   Overall Strength Comments  non painful with testing    Strength Assessment Site  Hip;Knee    Right/Left Hip  Left    Left Hip Flexion  4-/5    Left Hip Extension  4+/5    Left Hip External Rotation  4+/5    Left Hip Internal Rotation  4+/5    Left Hip ABduction  4/5    Left Hip ADduction  4+/5    Right/Left Knee  Left    Left Knee Flexion  4-/5    Left Knee Extension  4/5      Flexibility   Soft Tissue Assessment /Muscle Length  no   Pt hypermobile, no restriction in bil  LEs     Palpation   Patella mobility  WNL, maybe hypermobile medial/laterally   negative apprehension test   Palpation comment  Lt: knee medial joint line, ITB, tibial tuberosity, patellar tendon all tender      Special Tests    Special Tests  Knee Special Tests    Knee Special tests   Patellofemoral Apprehension Test;other;McConnell Test      McConnell Test   Findings  Negative    Side  Left      Patellofemoral Apprehension Test    Findings  Negative    Side   Left      other    Side   Left    Comments  Valgus stress test + for laxity and pain medial knee, less laxity in full extension vs 20 deg flexion      Transfers   Comments  able to perform full squat with Lt knee pain afterwards      Ambulation/Gait   Gait Comments  slight genu valgum and recurvatum, excessive transverse plane motion of pelvis      High Level Balance   High Level Balance Comments  able to perform SLS bil without LOB x 10" each                Objective measurements completed on examination: See above findings.              PT Education - 06/25/19 1056    Education Details  Access Code: [email or web address]       PT Short Term Goals - 06/25/19 1216      PT SHORT TERM GOAL #1   Title  Pt will be ind in initial HEP to include proper form demo for sit to stand.    Time  2    Period  Weeks    Status  New    Target Date  07/09/19        PT Long Term Goals - 06/25/19 1213      PT LONG TERM GOAL #1   Title  Pt will  be ind in advanced HEP and understand how to safely progress    Time  8    Period  Weeks    Status  New    Target Date  08/20/19      PT LONG TERM GOAL #2   Title  Pt will achieve 5/5 for all hip and knee muscle groups to provide more dynamic support for Lt knee.    Time  8    Period  Weeks    Status  New    Target Date  08/20/19      PT LONG TERM GOAL #3   Title  Pt will achieve FOTO score of </= 23% to demo less limitation.    Time  8    Period  Weeks     Status  New    Target Date  08/20/19      PT LONG TERM GOAL #4   Title  Pt will demo proper knee control in squats and stairs to reduce risk of re-injury    Time  8    Period  Weeks    Status  New    Target Date  08/20/19             Plan - 06/25/19 1203    Clinical Impression Statement  Pt is a pleasant 33yo female with onset of Lt knee pain approx 3 weeks ago when she sat on foot tucked up under her at an angle.  She felt a pop and knee has felt unstable.  Pain has improved since injury and she has self-discharged use of Lt knee sleeve she used from a prior patellar dislocation in past of the same knee.  There is no erethema.  Meniscus, drawer tests and patellar apprehension tests are negative.  She has some medial laxity of MCL on valgus stress testing with medial joint line tenderness.  She has systemic hypermobility with genu recurvatum and excessive flexibility of LEs but she demonstrates knowledge not to lock knees in standing and with gait.  She has some Lt hip and knee weakness and poor body mechanics (all knee flexion with pain) for squat which improved with PT cueing hip hinge today.  Pt is motivated and interested in building HEP to address weakness surrounding Lt knee and prevent further injury.  PT recommends ther ex, body mechanics and stabilization training 1x/week x 8 weeks.    Personal Factors and Comorbidities  Comorbidity 1;Past/Current Experience    Comorbidities  obesity, prior injury of same knee    Examination-Activity Limitations  Squat;Sit    Examination-Participation Restrictions  Driving    Stability/Clinical Decision Making  Stable/Uncomplicated    Clinical Decision Making  Low    Rehab Potential  Excellent    PT Frequency  1x / week    PT Duration  8 weeks    PT Treatment/Interventions  ADLs/Self Care Home Management;Cryotherapy;Electrical Stimulation;Iontophoresis 4mg /ml Dexamethasone;Moist Heat;Gait training;Stair training;Functional mobility  training;Therapeutic activities;Therapeutic exercise;Balance training;Neuromuscular re-education;Patient/family education;Manual techniques;Dry needling;Passive range of motion;Taping;Spinal Manipulations;Joint Manipulations    PT Next Visit Plan  f/u on HEP and progress for hip/knee strength, review squat body mechanics, closed chain hip (monster walks, sidestepping, bridging), asssess stairs and control with low step down    PT Home Exercise Plan  Access Code: [email or web address]    Recommended Other Services  is cert signed?    Consulted and Agree with Plan of Care  Patient       Patient will benefit from skilled therapeutic intervention in order to improve  the following deficits and impairments:  Obesity, Pain, Hypermobility, Improper body mechanics, Postural dysfunction, Decreased strength  Visit Diagnosis: Acute pain of left knee - Plan: PT plan of care cert/re-cert  Muscle weakness (generalized) - Plan: PT plan of care cert/re-cert     Problem List Patient Active Problem List   Diagnosis Date Noted  . Morbid obesity with BMI of 40.0-44.9, adult ([place]) 06/12/2019    Ajit Errico E Haniyah Maciolek 06/25/2019, 12:19 PM  [facility] [address] [place], [place], [address] Phone: [phone]   Fax:  [phone]  Name: Kathleen Joyce MRN: [id number] Date of Birth: [birth year]

## 2019-06-25 NOTE — Patient Instructions (Signed)
Access Code: [email or web address]  URL: [email or web address]  Date: 06/25/2019  Prepared by: Venetia Night Sebastian Lurz   Exercises  Sit to Stand - 10 reps - 3 sets - 1x daily - 7x weekly  Straight Leg Raise - 10 reps - 3 sets - 1x daily - 7x weekly  Sidelying Hip Abduction - 10 reps - 3 sets - 1x daily - 7x weekly

## 2019-07-02 DIAGNOSIS — F411 Generalized anxiety disorder: Secondary | ICD-10-CM | POA: Diagnosis not present

## 2019-07-02 DIAGNOSIS — F321 Major depressive disorder, single episode, moderate: Secondary | ICD-10-CM | POA: Diagnosis not present

## 2019-07-09 ENCOUNTER — Telehealth: Payer: Self-pay | Admitting: Physical Therapy

## 2019-07-09 ENCOUNTER — Ambulatory Visit: Payer: BC Managed Care – PPO | Admitting: Physical Therapy

## 2019-07-09 DIAGNOSIS — F321 Major depressive disorder, single episode, moderate: Secondary | ICD-10-CM | POA: Diagnosis not present

## 2019-07-09 DIAGNOSIS — F411 Generalized anxiety disorder: Secondary | ICD-10-CM | POA: Diagnosis not present

## 2019-07-09 NOTE — Telephone Encounter (Signed)
PTA called pt for no show appt today. She reports an emergency came up and could not make it today. She will be here for her Monday appt.   [name], PTA @TODAY @ 9:41 AM

## 2019-07-11 ENCOUNTER — Other Ambulatory Visit: Payer: Self-pay

## 2019-07-11 ENCOUNTER — Encounter (INDEPENDENT_AMBULATORY_CARE_PROVIDER_SITE_OTHER): Payer: Self-pay | Admitting: Family Medicine

## 2019-07-11 ENCOUNTER — Encounter: Payer: Self-pay | Admitting: Family Medicine

## 2019-07-11 ENCOUNTER — Ambulatory Visit (INDEPENDENT_AMBULATORY_CARE_PROVIDER_SITE_OTHER): Payer: BC Managed Care – PPO | Admitting: Family Medicine

## 2019-07-11 VITALS — BP 113/77 | HR 59 | Temp 98.4°F | Ht 70.0 in | Wt 284.0 lb

## 2019-07-11 DIAGNOSIS — E878 Other disorders of electrolyte and fluid balance, not elsewhere classified: Secondary | ICD-10-CM | POA: Diagnosis not present

## 2019-07-11 DIAGNOSIS — R0609 Other forms of dyspnea: Secondary | ICD-10-CM

## 2019-07-11 DIAGNOSIS — Z9189 Other specified personal risk factors, not elsewhere classified: Secondary | ICD-10-CM | POA: Diagnosis not present

## 2019-07-11 DIAGNOSIS — Z6841 Body Mass Index (BMI) 40.0 and over, adult: Secondary | ICD-10-CM

## 2019-07-11 DIAGNOSIS — E7849 Other hyperlipidemia: Secondary | ICD-10-CM

## 2019-07-11 DIAGNOSIS — R5383 Other fatigue: Secondary | ICD-10-CM

## 2019-07-11 DIAGNOSIS — R739 Hyperglycemia, unspecified: Secondary | ICD-10-CM

## 2019-07-11 DIAGNOSIS — N938 Other specified abnormal uterine and vaginal bleeding: Secondary | ICD-10-CM | POA: Diagnosis not present

## 2019-07-11 DIAGNOSIS — F3289 Other specified depressive episodes: Secondary | ICD-10-CM | POA: Diagnosis not present

## 2019-07-11 DIAGNOSIS — R0600 Dyspnea, unspecified: Secondary | ICD-10-CM | POA: Diagnosis not present

## 2019-07-11 DIAGNOSIS — Z0289 Encounter for other administrative examinations: Secondary | ICD-10-CM

## 2019-07-12 ENCOUNTER — Encounter (INDEPENDENT_AMBULATORY_CARE_PROVIDER_SITE_OTHER): Payer: Self-pay | Admitting: Family Medicine

## 2019-07-12 DIAGNOSIS — F32A Depression, unspecified: Secondary | ICD-10-CM

## 2019-07-12 DIAGNOSIS — E7849 Other hyperlipidemia: Secondary | ICD-10-CM

## 2019-07-12 DIAGNOSIS — F329 Major depressive disorder, single episode, unspecified: Secondary | ICD-10-CM

## 2019-07-12 DIAGNOSIS — O99210 Obesity complicating pregnancy, unspecified trimester: Secondary | ICD-10-CM | POA: Insufficient documentation

## 2019-07-12 HISTORY — DX: Major depressive disorder, single episode, unspecified: F32.9

## 2019-07-12 HISTORY — DX: Depression, unspecified: F32.A

## 2019-07-12 HISTORY — DX: Other hyperlipidemia: E78.49

## 2019-07-13 LAB — CBC WITH DIFFERENTIAL/PLATELET
Basophils Absolute: 0 10*3/uL (ref 0.0–0.2)
Basos: 0 %
EOS (ABSOLUTE): 0.1 10*3/uL (ref 0.0–0.4)
Eos: 1 %
Hemoglobin: 12.4 g/dL (ref 11.1–15.9)
Immature Grans (Abs): 0 10*3/uL (ref 0.0–0.1)
Immature Granulocytes: 0 %
Lymphocytes Absolute: 2.4 10*3/uL (ref 0.7–3.1)
Lymphs: 33 %
MCH: 26.8 pg (ref 26.6–33.0)
MCHC: 31.8 g/dL (ref 31.5–35.7)
MCV: 84 fL (ref 79–97)
Monocytes Absolute: 0.5 10*3/uL (ref 0.1–0.9)
Monocytes: 6 %
Neutrophils Absolute: 4.3 10*3/uL (ref 1.4–7.0)
Neutrophils: 60 %
Platelets: 224 10*3/uL (ref 150–450)
RBC: 4.63 x10E6/uL (ref 3.77–5.28)
RDW: 14.8 % (ref 11.7–15.4)
WBC: 7.3 10*3/uL (ref 3.4–10.8)

## 2019-07-13 LAB — LIPID PANEL WITH LDL/HDL RATIO
Cholesterol, Total: 159 mg/dL (ref 100–199)
HDL: 46 mg/dL (ref >39)
LDL Chol Calc (NIH): 98 mg/dL (ref 0–99)
LDL/HDL Ratio: 2.1 ratio (ref 0.0–3.2)
Triglycerides: 78 mg/dL (ref 0–149)
VLDL Cholesterol Cal: 15 mg/dL (ref 5–40)

## 2019-07-13 LAB — COMPREHENSIVE METABOLIC PANEL
ALT: 16 IU/L (ref 0–32)
AST: 15 IU/L (ref 0–40)
Albumin/Globulin Ratio: 0.9 — ABNORMAL LOW (ref 1.2–2.2)
Albumin: 3.6 g/dL — ABNORMAL LOW (ref 3.8–4.8)
Alkaline Phosphatase: 56 IU/L (ref 39–117)
BUN/Creatinine Ratio: 8 — ABNORMAL LOW (ref 9–23)
BUN: 7 mg/dL (ref 6–20)
Bilirubin Total: <0.2 mg/dL (ref 0.0–1.2)
CO2: 21 mmol/L (ref 20–29)
Calcium: 9.1 mg/dL (ref 8.7–10.2)
Chloride: 104 mmol/L (ref 96–106)
Creatinine, Ser: 0.87 mg/dL (ref 0.57–1.00)
GFR calc Af Amer: 103 mL/min/{1.73_m2} (ref >59)
GFR calc non Af Amer: 89 mL/min/{1.73_m2} (ref >59)
Globulin, Total: 3.9 g/dL (ref 1.5–4.5)
Glucose: 82 mg/dL (ref 65–99)
Potassium: 4.6 mmol/L (ref 3.5–5.2)
Sodium: 139 mmol/L (ref 134–144)
Total Protein: 7.5 g/dL (ref 6.0–8.5)

## 2019-07-13 LAB — ANEMIA PANEL
Ferritin: 11 ng/mL — ABNORMAL LOW (ref 15–150)
Folate, Hemolysate: 283 ng/mL
Folate, RBC: 726 ng/mL (ref >498)
Hematocrit: 39 % (ref 34.0–46.6)
Iron Saturation: 7 % — CL (ref 15–55)
Iron: 21 ug/dL — ABNORMAL LOW (ref 27–159)
Retic Ct Pct: 1 % (ref 0.6–2.6)
Total Iron Binding Capacity: 315 ug/dL (ref 250–450)
UIBC: 294 ug/dL (ref 131–425)
Vitamin B-12: 451 pg/mL (ref 232–1245)

## 2019-07-13 LAB — INSULIN, RANDOM: INSULIN: 24.1 u[IU]/mL (ref 2.6–24.9)

## 2019-07-13 LAB — T4, FREE: Free T4: 0.95 ng/dL (ref 0.82–1.77)

## 2019-07-13 LAB — HEMOGLOBIN A1C
Est. average glucose Bld gHb Est-mCnc: 108 mg/dL
Hgb A1c MFr Bld: 5.4 % (ref 4.8–5.6)

## 2019-07-13 LAB — VITAMIN D 25 HYDROXY (VIT D DEFICIENCY, FRACTURES): Vit D, 25-Hydroxy: 14.9 ng/mL — ABNORMAL LOW (ref 30.0–100.0)

## 2019-07-13 LAB — T3: T3, Total: 128 ng/dL (ref 71–180)

## 2019-07-13 LAB — TSH: TSH: 3.18 u[IU]/mL (ref 0.450–4.500)

## 2019-07-16 ENCOUNTER — Encounter (INDEPENDENT_AMBULATORY_CARE_PROVIDER_SITE_OTHER): Payer: Self-pay | Admitting: Family Medicine

## 2019-07-16 DIAGNOSIS — F411 Generalized anxiety disorder: Secondary | ICD-10-CM | POA: Diagnosis not present

## 2019-07-16 DIAGNOSIS — F321 Major depressive disorder, single episode, moderate: Secondary | ICD-10-CM | POA: Diagnosis not present

## 2019-07-16 NOTE — Progress Notes (Signed)
Office: [phone]  /  Fax: [phone]   Dear Dr. Martinique,   Thank you for referring Kathleen Joyce to our clinic. The following note includes my evaluation and treatment recommendations.  HPI:   Chief Complaint: OBESITY    Kathleen Joyce has been referred by Kathleen G. Martinique, MD for consultation regarding her obesity and obesity related comorbidities.    Kathleen Joyce (MR# [id number]) is a 33 y.o. female who presents on 07/11/2019 for obesity evaluation and treatment. Current BMI is Body mass index is 40.75 kg/m. Kathleen Joyce has been struggling with her weight for many years and has been unsuccessful in either losing weight, maintaining weight loss, or reaching her healthy weight goal.     Kathleen Joyce states she is currently in the action stage of change and ready to dedicate time achieving and maintaining a healthier weight. Kathleen Joyce is interested in becoming our patient and working on intensive lifestyle modifications including (but not limited to) diet, exercise and weight loss.    Kathleen Joyce states her family eats meals together she thinks her family will eat healthier with  her her desired weight loss is 84 lbs she has been heavy most of  her life she started gaining weight in college her heaviest weight ever was 330 lbs she has significant food cravings issues  she snacks frequently in the evenings she skips meals frequently she is frequently drinking liquids with calories she frequently makes poor food choices she has problems with excessive hunger  she frequently eats larger portions than normal  she struggles with emotional eating    Kathleen Joyce feels her energy is lower than it should be. This has worsened with weight gain and has not worsened recently. Kathleen Joyce admits to daytime somnolence and  admits to waking up still tired. Patient is at risk for obstructive sleep apnea. Patent has a history of symptoms of daytime Kathleen and morning headache. Patient generally gets 4 hours of  sleep per night, and states they generally have difficulty falling asleep. Snoring is present. Apneic episodes are not present. Epworth Sleepiness Score is 2.  Dyspnea on exertion Kathleen Joyce notes increasing shortness of breath with exercising and seems to be worsening over time with weight gain. She notes getting out of breath sooner with activity than she used to. This has not gotten worse recently. Kathleen Joyce denies orthopnea.  Hyperglycemia Kathleen Joyce has a history of some elevated blood glucose readings without a diagnosis of diabetes. She states she drinks a lot of sugar beverages such as, soda, milk, juice, sweet tea, and coffee with milk and sugar. She admits to polyphagia.  At risk for diabetes Kathleen Joyce is at higher than average risk for developing diabetes due to her obesity and hyperglycemia. She currently denies polyuria or polydipsia.  Sleep Disorder Breathing Kathleen Joyce states she snores with morning headaches.   Depression with Emotional Eating Behaviors Kathleen Joyce states she eats for comfort and reward. She feels ashamed of her food choices and endorses some binge eating behaviors. Kathleen Joyce struggles with emotional eating and using food for comfort to the extent that it is negatively impacting her health. She often snacks when she is not hungry. Kathleen Joyce sometimes feels she is out of control and then feels guilty that she made poor food choices. She has been working on behavior modification techniques to help reduce her emotional eating and has been somewhat successful. She shows no sign of suicidal or homicidal ideations.  Depression Screen Kathleen Joyce's Food and Mood (modified PHQ-9) score was  Depression screen PHQ  2/9 07/11/2019  Decreased Interest 2  Down, Depressed, Hopeless 3  PHQ - 2 Score 5  Altered sleeping 3  Tired, decreased energy 3  Change in appetite 3  Feeling bad or failure about yourself  2  Trouble concentrating 3  Moving slowly or fidgety/restless 2  Suicidal thoughts 3  PHQ-9 Score 24    Difficult doing work/chores Somewhat difficult    ASSESSMENT AND PLAN:  Other Kathleen - Plan: EKG 12-Lead, Anemia panel, CBC w/Diff/Platelet, Vitamin D (25 hydroxy), T3, T4, free, TSH  Dyspnea on exertion - Plan: Lipid Panel With LDL/HDL Ratio  Other hyperlipidemia  Hyperglycemia - Plan: Comprehensive Metabolic Panel (CMET), HgB A1c, Insulin, random  Other depression, emotional eating  At risk for diabetes mellitus  Class 3 severe obesity with serious comorbidity and body mass index (BMI) of 40.0 to 44.9 in adult, unspecified obesity type (HCC)  PLAN:  Kathleen Joyce was informed that her Kathleen may be related to obesity, depression or many other causes. Labs will be ordered, and in the meanwhile Kathleen Joyce has agreed to work on diet, exercise and weight loss to help with Kathleen. Proper sleep hygiene was discussed including the need for 7-8 hours of quality sleep each night. A sleep study was not ordered based on symptoms and Epworth score.  Dyspnea on exertion Kathleen Joyce's shortness of breath appears to be obesity related and exercise induced. She has agreed to work on weight loss and gradually increase exercise to treat her exercise induced shortness of breath. If Kathleen Joyce follows our instructions and loses weight without improvement of her shortness of breath, we will plan to refer to pulmonology. We will monitor this condition regularly. Kathleen Joyce agrees to this plan.  Hyperglycemia Fasting labs will be obtained today and results with be discussed with Kathleen Joyce in 2 weeks at her follow up visit. In the meanwhile Kathleen Joyce was started on a lower simple carbohydrate diet and will work on weight loss efforts.  Diabetes risk counseling Kathleen Joyce was given extended (15 minutes) diabetes prevention counseling today. She is 33 y.o. female and has risk factors for diabetes including obesity and hyperglycemia. We discussed intensive lifestyle modifications today with an emphasis on weight loss as well as  increasing exercise and decreasing simple carbohydrates in her diet.  Sleep Disorder Breathing We will discuss possible sleep apnea at her future visit.  Depression with Emotional Eating Behaviors We discussed behavior modification techniques today to help Skya deal with her emotional eating and depression. We will refer to Dr. Mallie Mussel, our Bariatric Psychologist for evaluation.  Depression Screen Kathleen Joyce had a strongly positive depression screening. Depression is commonly associated with obesity and often results in emotional eating behaviors. We will monitor this closely and work on CBT to help improve the non-hunger eating patterns. Referral to Psychology may be required if no improvement is seen as she continues in our clinic.  Obesity Kathleen Joyce is currently in the action stage of change and her goal is to continue with weight loss efforts. I recommend Kathleen Joyce begin the structured treatment plan as follows:  She has agreed to follow the Category 2 plan Kathleen Joyce has been instructed to eventually work up to a goal of 150 minutes of combined cardio and strengthening exercise per week for weight loss and overall health benefits. We discussed the following Behavioral Modification Strategies today: increasing lean protein intake, decreasing simple carbohydrates, increasing vegetables, increase H20 intake, work on meal planning and easy cooking plans, decrease liquid calories, and keeping healthy foods in the home  She was informed of the importance of frequent follow up visits to maximize her success with intensive lifestyle modifications for her multiple health conditions. She was informed we would discuss her lab results at her next visit unless there is a critical issue that needs to be addressed sooner. Kathleen Joyce agreed to keep her next visit at the agreed upon time to discuss these results.  ALLERGIES: Allergies  Allergen Reactions  . Banana     MEDICATIONS: No current outpatient medications on file  prior to visit.   No current facility-administered medications on file prior to visit.     PAST MEDICAL HISTORY: Past Medical History:  Diagnosis Date  . Anemia   . Depression 07/12/2019  . Left knee pain   . Other hyperlipidemia 07/12/2019  . Plantar fasciitis   . Right hip pain     PAST SURGICAL HISTORY: History reviewed. No pertinent surgical history.  SOCIAL HISTORY: Social History   Tobacco Use  . Smoking status: Former Research scientist (life sciences)  . Smokeless tobacco: Never Used  Substance Use Topics  . Alcohol use: Yes    Comment: rare  . Drug use: Not Currently    FAMILY HISTORY: Family History  Problem Relation Age of Onset  . Lupus Mother   . Kidney disease Mother   . Depression Mother   . Diverticulitis Father   . Depression Father   . Diabetes Maternal Aunt   . Breast cancer Maternal Aunt 67  . Cancer Paternal Grandfather        colon    ROS: Review of Systems  Constitutional: Positive for malaise/Kathleen. Negative for weight loss.  Respiratory: Positive for shortness of breath (with exertion).   Cardiovascular: Negative for orthopnea.  Genitourinary: Negative for frequency.  Neurological: Positive for headaches.  Endo/Heme/Allergies: Negative for polydipsia.       Positive polyphagia  Psychiatric/Behavioral: Positive for depression. Negative for suicidal ideas.    PHYSICAL EXAM: Blood pressure 113/77, pulse (!) 59, temperature 98.4 F (36.9 C), temperature source Oral, height 5\' 10"  (1.778 m), weight 284 lb (128.8 kg), last menstrual period 06/24/2019, SpO2 100 %. Body mass index is 40.75 kg/m. Physical Exam Vitals signs reviewed.  Constitutional:      Appearance: Normal appearance. She is obese.  HENT:     Head: Normocephalic and atraumatic.     Nose: Nose normal.  Eyes:     General: No scleral icterus.    Extraocular Movements: Extraocular movements intact.  Neck:     Musculoskeletal: Normal range of motion and neck supple.     Comments: No  thyromegaly present Cardiovascular:     Rate and Rhythm: Normal rate and regular rhythm.     Pulses: Normal pulses.     Heart sounds: Normal heart sounds.  Pulmonary:     Effort: Pulmonary effort is normal. No respiratory distress.     Breath sounds: Normal breath sounds.  Abdominal:     Palpations: Abdomen is soft.     Tenderness: There is no abdominal tenderness.     Comments: + Obesity  Musculoskeletal: Normal range of motion.     Right lower leg: No edema.     Left lower leg: No edema.  Skin:    General: Skin is warm and dry.  Neurological:     Mental Status: She is alert and oriented to person, place, and time.     Coordination: Coordination normal.  Psychiatric:        Mood and Affect: Mood normal.  Behavior: Behavior normal.     RECENT LABS AND TESTS: BMET    Component Value Date/Time   NA 139 07/11/2019 0935   K 4.6 07/11/2019 0935   CL 104 07/11/2019 0935   CO2 21 07/11/2019 0935   GLUCOSE 82 07/11/2019 0935   GLUCOSE 91 05/12/2018 0847   BUN 7 07/11/2019 0935   CREATININE 0.87 07/11/2019 0935   CALCIUM 9.1 07/11/2019 0935   GFRNONAA 89 07/11/2019 0935   GFRAA 103 07/11/2019 0935   Lab Results  Component Value Date   HGBA1C 5.4 07/11/2019   Lab Results  Component Value Date   INSULIN 24.1 07/11/2019   CBC    Component Value Date/Time   WBC 7.3 07/11/2019 0935   WBC 6.3 05/12/2018 0847   RBC 4.63 07/11/2019 0935   RBC 4.61 05/12/2018 0847   HGB 12.4 07/11/2019 0935   HCT 39.0 07/11/2019 0935   PLT 224 07/11/2019 0935   MCV 84 07/11/2019 0935   MCH 26.8 07/11/2019 0935   MCHC 31.8 07/11/2019 0935   MCHC 32.4 05/12/2018 0847   RDW 14.8 07/11/2019 0935   LYMPHSABS 2.4 07/11/2019 0935   MONOABS 0.8 05/31/2009 1415   EOSABS 0.1 07/11/2019 0935   BASOSABS 0.0 07/11/2019 0935   Iron/TIBC/Ferritin/ %Sat    Component Value Date/Time   IRON 21 (L) 07/11/2019 0935   TIBC 315 07/11/2019 0935   FERRITIN 11 (L) 07/11/2019 0935   IRONPCTSAT 7  (LL) 07/11/2019 0935   Lipid Panel     Component Value Date/Time   CHOL 159 07/11/2019 0935   TRIG 78 07/11/2019 0935   HDL 46 07/11/2019 0935   CHOLHDL 4 05/12/2018 0847   VLDL 14.6 05/12/2018 0847   LDLCALC 98 07/11/2019 0935   Hepatic Function Panel     Component Value Date/Time   PROT 7.5 07/11/2019 0935   ALBUMIN 3.6 (L) 07/11/2019 0935   AST 15 07/11/2019 0935   ALT 16 07/11/2019 0935   ALKPHOS 56 07/11/2019 0935   BILITOT <0.2 07/11/2019 0935      Component Value Date/Time   TSH 3.180 07/11/2019 0935    ECG  shows NSR with a rate of 63 BPM INDIRECT CALORIMETER done today shows a VO2 of 251 and a REE of 1747.  Her calculated basal metabolic rate is [id number] thus her basal metabolic rate is worse than expected.       OBESITY BEHAVIORAL INTERVENTION VISIT  Today's visit was # 1   Starting weight: 284 lbs Starting date: 07/11/2019 Today's weight : 284 lbs Today's date: 07/11/2019 Total lbs lost to date: 0    ASK: We discussed the diagnosis of obesity with Marcel Z Foss today and Judie agreed to give [place] permission to discuss obesity behavioral modification therapy today.  ASSESS: Atziry has the diagnosis of obesity and her BMI today is 40.75 Jamine is in the action stage of change   ADVISE: Kathleen Joyce was educated on the multiple health risks of obesity as well as the benefit of weight loss to improve her health. She was advised of the need for long term treatment and the importance of lifestyle modifications to improve her current health and to decrease her risk of future health problems.  AGREE: Multiple dietary modification options and treatment options were discussed and  Ebonie agreed to follow the recommendations documented in the above note.  ARRANGE: Dunia was educated on the importance of frequent visits to treat obesity as outlined per CMS and USPSTF guidelines and agreed to  schedule her next follow up appointment today.  Wilhemena Durie, am acting as  transcriptionist for Briscoe Deutscher, DO  I have reviewed the above documentation for accuracy and completeness, and I agree with the above. Briscoe Deutscher, DO

## 2019-07-23 ENCOUNTER — Ambulatory Visit: Payer: BC Managed Care – PPO | Admitting: Physical Therapy

## 2019-07-23 DIAGNOSIS — F411 Generalized anxiety disorder: Secondary | ICD-10-CM | POA: Diagnosis not present

## 2019-07-23 DIAGNOSIS — F321 Major depressive disorder, single episode, moderate: Secondary | ICD-10-CM | POA: Diagnosis not present
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
## 2019-07-25 ENCOUNTER — Encounter (INDEPENDENT_AMBULATORY_CARE_PROVIDER_SITE_OTHER): Payer: Self-pay | Admitting: Family Medicine

## 2019-07-25 ENCOUNTER — Other Ambulatory Visit: Payer: Self-pay

## 2019-07-25 ENCOUNTER — Ambulatory Visit (INDEPENDENT_AMBULATORY_CARE_PROVIDER_SITE_OTHER): Payer: BC Managed Care – PPO | Admitting: Family Medicine

## 2019-07-25 VITALS — BP 116/78 | HR 70 | Temp 98.3°F | Ht 70.0 in | Wt 283.0 lb

## 2019-07-25 DIAGNOSIS — Z9189 Other specified personal risk factors, not elsewhere classified: Secondary | ICD-10-CM

## 2019-07-25 DIAGNOSIS — E8881 Metabolic syndrome: Secondary | ICD-10-CM

## 2019-07-25 DIAGNOSIS — F3289 Other specified depressive episodes: Secondary | ICD-10-CM | POA: Diagnosis not present

## 2019-07-25 DIAGNOSIS — D508 Other iron deficiency anemias: Secondary | ICD-10-CM

## 2019-07-25 DIAGNOSIS — E559 Vitamin D deficiency, unspecified: Secondary | ICD-10-CM | POA: Diagnosis not present

## 2019-07-25 DIAGNOSIS — Z6841 Body Mass Index (BMI) 40.0 and over, adult: Secondary | ICD-10-CM

## 2019-07-25 MED ORDER — VITAMIN D (ERGOCALCIFEROL) 1.25 MG (50000 UNIT) PO CAPS: 50000.0000 [IU] | ORAL_CAPSULE | ORAL | 0 refills | Status: DC

## 2019-07-25 MED ORDER — FERROUS SULFATE 325 (65 FE) MG PO TABS: 325.0000 mg | ORAL_TABLET | Freq: Every day | ORAL | 0 refills | Status: DC

## 2019-07-25 MED ORDER — BUPROPION HCL ER (SR) 150 MG PO TB12: 150.0000 mg | ORAL_TABLET | Freq: Every day | ORAL | 0 refills | Status: DC

## 2019-07-25 NOTE — Progress Notes (Signed)
Office: [phone]  /  Fax: [phone]   HPI:   Chief Complaint: OBESITY Kathleen Joyce is here to discuss her progress with her obesity treatment plan. She is on the Category 2 plan and is following her eating plan approximately 50 % of the time. She states she walked for 45 minutes 3-4 times per week. [name] got engaged last week. She is super excited and celebrated a lot.  Her weight is 283 lb (128.4 kg) today and has had a weight loss of 1 pound over a period of 2 weeks since her last visit. She has lost 1 lb since starting treatment with [place].  Insulin Resistance Analya has a diagnosis of insulin resistance based on her elevated fasting insulin level >5. Although Myrical's blood glucose readings are still under good control, insulin resistance puts her at greater risk of metabolic syndrome and diabetes. She is not taking metformin currently and continues to work on diet and exercise to decrease risk of diabetes.  At risk for diabetes [name] is at higher than average risk for developing diabetes due to her obesity and insulin resistance. She currently denies polyuria or polydipsia.  Vitamin D Deficiency Dessiree has a diagnosis of vitamin D deficiency. She is not currently taking Vit D and denies nausea, vomiting or muscle weakness.  Iron Deficiency Anemia [name] has a diagnosis of anemia. She is not on iron supplementation.  Depression with Emotional Eating Behaviors [name] is struggling with emotional eating and using food for comfort to the extent that it is negatively impacting her health. She often snacks when she is not hungry. [name] sometimes feels she is out of control and then feels guilty that she made poor food choices. She has been working on behavior modification techniques to help reduce her emotional eating and has been somewhat successful. She is also working on smoking cessation. She shows no sign of suicidal or homicidal ideations.  Depression screen [facility] 2/9 07/11/2019 05/14/2018    Decreased Interest 2 0  Down, Depressed, Hopeless 3 0  PHQ - 2 Score 5 0  Altered sleeping 3 -  Tired, decreased energy 3 -  Change in appetite 3 -  Feeling bad or failure about yourself  2 -  Trouble concentrating 3 -  Moving slowly or fidgety/restless 2 -  Suicidal thoughts 3 -  PHQ-9 Score 24 -  Difficult doing work/chores Somewhat difficult -    ASSESSMENT AND PLAN:  Insulin resistance  Vitamin D deficiency - Plan: Vitamin D, Ergocalciferol, (DRISDOL) 1.25 MG (50000 UT) CAPS capsule  Other iron deficiency anemia - Plan: ferrous sulfate (FERROUSUL) 325 (65 FE) MG tablet  Other depression, emotional eating - Plan: buPROPion (WELLBUTRIN SR) 150 MG 12 hr tablet  At risk for diabetes mellitus  Class 3 severe obesity with serious comorbidity and body mass index (BMI) of 40.0 to 44.9 in adult, unspecified obesity type (HCC)  PLAN:  Insulin Resistance [name] will continue to work on weight loss, exercise, and decreasing simple carbohydrates in her diet to help decrease the risk of diabetes. We dicussed metformin including benefits and risks. She was informed that eating too many simple carbohydrates or too many calories at one sitting increases the likelihood of GI side effects. [name] agrees to follow up with [place] as directed to monitor her progress.  Diabetes risk counseling [name] was given extended (15 minutes) diabetes prevention counseling today. She is 33 y.o. female and has risk factors for diabetes including obesity and insulin resistance. We discussed intensive lifestyle modifications today  with an emphasis on weight loss as well as increasing exercise and decreasing simple carbohydrates in her diet.  Vitamin D Deficiency [name] was informed that low vitamin D levels contributes to fatigue and are associated with obesity, breast, and colon cancer. [name] agrees to start prescription Vit D 50,000 IU every week #4 with no refills. She will follow up for routine testing of vitamin  D, at least 2-3 times per year. She was informed of the risk of over-replacement of vitamin D and agrees to not increase her dose unless she discusses this with [place] first. [name] agrees to follow up with our clinic in 2 weeks.  Iron Deficiency Anemia The diagnosis of Iron deficiency anemia was discussed with [name] and was explained in detail. She was given suggestions of iron rich foods. [name] agrees to start ferrous sulfate 325 mg PO q AM #30 with no refills. [name] agrees to follow up with our clinic in 2 weeks.  Depression with Emotional Eating Behaviors We discussed behavior modification techniques today to help [name] deal with her emotional eating behaviors. [name] agrees to start Wellbutrin SR 150 mg PO daily #30 with no refills. Acacia agrees to follow up with our clinic in 2 weeks.  Obesity [name] is currently in the action stage of change. As such, her goal is to continue with weight loss efforts She has agreed to follow the Category 2 plan [name] has been instructed to work up to a goal of 150 minutes of combined cardio and strengthening exercise per week for weight loss and overall health benefits. We discussed the following Behavioral Modification Strategies today: increasing lean protein intake, decreasing simple carbohydrates, increasing vegetables, increase H20 intake, work on meal planning and easy cooking plans and celebration eating strategies    [name] has agreed to follow up with our clinic in 2 weeks. She was informed of the importance of frequent follow up visits to maximize her success with intensive lifestyle modifications for her multiple health conditions.  ALLERGIES: Allergies  Allergen Reactions  . Banana     MEDICATIONS: No current outpatient medications on file prior to visit.   No current facility-administered medications on file prior to visit.     PAST MEDICAL HISTORY: Past Medical History:  Diagnosis Date  . Anemia   . Depression 07/12/2019  . Left knee  pain   . Other hyperlipidemia 07/12/2019  . Plantar fasciitis   . Right hip pain     PAST SURGICAL HISTORY: History reviewed. No pertinent surgical history.  SOCIAL HISTORY: Social History   Tobacco Use  . Smoking status: Former Research scientist (life sciences)  . Smokeless tobacco: Never Used  Substance Use Topics  . Alcohol use: Yes    Comment: rare  . Drug use: Not Currently    FAMILY HISTORY: Family History  Problem Relation Age of Onset  . Lupus Mother   . Kidney disease Mother   . Depression Mother   . Diverticulitis Father   . Depression Father   . Diabetes Maternal Aunt   . Breast cancer Maternal Aunt 67  . Cancer Paternal Grandfather        colon    ROS: Review of Systems  Constitutional: Positive for weight loss.  Gastrointestinal: Negative for nausea and vomiting.  Genitourinary: Negative for frequency.  Musculoskeletal:       Negative muscle weakness  Endo/Heme/Allergies: Negative for polydipsia.  Psychiatric/Behavioral: Positive for depression. Negative for suicidal ideas.    PHYSICAL EXAM: Blood pressure 116/78, pulse 70, temperature 98.3 F (36.8 C),  temperature source Oral, height 5\' 10"  (1.778 m), weight 283 lb (128.4 kg), SpO2 100 %. Body mass index is 40.61 kg/m. Physical Exam Vitals signs reviewed.  Constitutional:      Appearance: Normal appearance. She is obese.  Cardiovascular:     Rate and Rhythm: Normal rate.     Pulses: Normal pulses.  Pulmonary:     Effort: Pulmonary effort is normal.     Breath sounds: Normal breath sounds.  Musculoskeletal: Normal range of motion.  Skin:    General: Skin is warm and dry.  Neurological:     Mental Status: She is alert and oriented to person, place, and time.  Psychiatric:        Mood and Affect: Mood normal.        Behavior: Behavior normal.     RECENT LABS AND TESTS: BMET    Component Value Date/Time   NA 139 07/11/2019 0935   K 4.6 07/11/2019 0935   CL 104 07/11/2019 0935   CO2 21 07/11/2019 0935    GLUCOSE 82 07/11/2019 0935   GLUCOSE 91 05/12/2018 0847   BUN 7 07/11/2019 0935   CREATININE 0.87 07/11/2019 0935   CALCIUM 9.1 07/11/2019 0935   GFRNONAA 89 07/11/2019 0935   GFRAA 103 07/11/2019 0935   Lab Results  Component Value Date   HGBA1C 5.4 07/11/2019   Lab Results  Component Value Date   INSULIN 24.1 07/11/2019   CBC    Component Value Date/Time   WBC 7.3 07/11/2019 0935   WBC 6.3 05/12/2018 0847   RBC 4.63 07/11/2019 0935   RBC 4.61 05/12/2018 0847   HGB 12.4 07/11/2019 0935   HCT 39.0 07/11/2019 0935   PLT 224 07/11/2019 0935   MCV 84 07/11/2019 0935   MCH 26.8 07/11/2019 0935   MCHC 31.8 07/11/2019 0935   MCHC 32.4 05/12/2018 0847   RDW 14.8 07/11/2019 0935   LYMPHSABS 2.4 07/11/2019 0935   MONOABS 0.8 05/31/2009 1415   EOSABS 0.1 07/11/2019 0935   BASOSABS 0.0 07/11/2019 0935   Iron/TIBC/Ferritin/ %Sat    Component Value Date/Time   IRON 21 (L) 07/11/2019 0935   TIBC 315 07/11/2019 0935   FERRITIN 11 (L) 07/11/2019 0935   IRONPCTSAT 7 (LL) 07/11/2019 0935   Lipid Panel     Component Value Date/Time   CHOL 159 07/11/2019 0935   TRIG 78 07/11/2019 0935   HDL 46 07/11/2019 0935   CHOLHDL 4 05/12/2018 0847   VLDL 14.6 05/12/2018 0847   LDLCALC 98 07/11/2019 0935   Hepatic Function Panel     Component Value Date/Time   PROT 7.5 07/11/2019 0935   ALBUMIN 3.6 (L) 07/11/2019 0935   AST 15 07/11/2019 0935   ALT 16 07/11/2019 0935   ALKPHOS 56 07/11/2019 0935   BILITOT <0.2 07/11/2019 0935      Component Value Date/Time   TSH 3.180 07/11/2019 0935      OBESITY BEHAVIORAL INTERVENTION VISIT  Today's visit was # 2   Starting weight: 284 lbs Starting date: 07/11/2019 Today's weight : 283 lbs Today's date: 07/25/2019 Total lbs lost to date: 1    ASK: We discussed the diagnosis of obesity with Kiyona Z Saintjean today and Arsema agreed to give [place] permission to discuss obesity behavioral modification therapy today.  ASSESS: Aryia has  the diagnosis of obesity and her BMI today is 40.61 Brinleigh is in the action stage of change   ADVISE: Jenette was educated on the multiple health risks of obesity  as well as the benefit of weight loss to improve her health. She was advised of the need for long term treatment and the importance of lifestyle modifications to improve her current health and to decrease her risk of future health problems.  AGREE: Multiple dietary modification options and treatment options were discussed and  Casidy agreed to follow the recommendations documented in the above note.  ARRANGE: Chanette was educated on the importance of frequent visits to treat obesity as outlined per CMS and USPSTF guidelines and agreed to schedule her next follow up appointment today.  Wilhemena Durie, am acting as transcriptionist for Briscoe Deutscher, DO  I have reviewed the above documentation for accuracy and completeness, and I agree with the above. Briscoe Deutscher, DO

## 2019-07-26 ENCOUNTER — Encounter (INDEPENDENT_AMBULATORY_CARE_PROVIDER_SITE_OTHER): Payer: Self-pay | Admitting: Family Medicine

## 2019-07-30 ENCOUNTER — Ambulatory Visit: Payer: BC Managed Care – PPO | Admitting: Physical Therapy

## 2019-07-31 ENCOUNTER — Other Ambulatory Visit: Payer: Self-pay

## 2019-07-31 ENCOUNTER — Ambulatory Visit (INDEPENDENT_AMBULATORY_CARE_PROVIDER_SITE_OTHER): Payer: BC Managed Care – PPO | Admitting: Psychology

## 2019-07-31 DIAGNOSIS — F3289 Other specified depressive episodes: Secondary | ICD-10-CM

## 2019-08-06 ENCOUNTER — Other Ambulatory Visit: Payer: Self-pay

## 2019-08-06 ENCOUNTER — Ambulatory Visit: Payer: BC Managed Care – PPO | Attending: Family Medicine | Admitting: Physical Therapy

## 2019-08-06 ENCOUNTER — Encounter: Payer: Self-pay | Admitting: Physical Therapy

## 2019-08-06 DIAGNOSIS — M6281 Muscle weakness (generalized): Secondary | ICD-10-CM | POA: Insufficient documentation

## 2019-08-06 DIAGNOSIS — M25562 Pain in left knee: Secondary | ICD-10-CM | POA: Diagnosis not present

## 2019-08-06 DIAGNOSIS — F411 Generalized anxiety disorder: Secondary | ICD-10-CM | POA: Diagnosis not present

## 2019-08-06 DIAGNOSIS — F321 Major depressive disorder, single episode, moderate: Secondary | ICD-10-CM | POA: Diagnosis not present

## 2019-08-06 NOTE — Therapy (Signed)
[facility] [address] [place], [place], [address] Phone: [phone]   Fax:  [phone]  Physical Therapy Treatment  Patient Details  Name: Kathleen Joyce MRN: [id number] Date of Birth: [birth year] Referring Provider (PT): Martinique, Betty G, MD   Encounter Date: 08/06/2019  PT End of Session - 08/06/19 1018    Visit Number  2    Date for PT Re-Evaluation  08/20/19    Authorization Type  [employer]    PT Start Time  0945   pT 30 MIN LATE   PT Stop Time  1015    PT Time Calculation (min)  30 min    Activity Tolerance  Patient tolerated treatment well    Behavior During Therapy  [facility] for tasks assessed/performed       Past Medical History:  Diagnosis Date  . Anemia   . Depression 07/12/2019  . Left knee pain   . Other hyperlipidemia 07/12/2019  . Plantar fasciitis   . Right hip pain     History reviewed. No pertinent surgical history.  There were no vitals filed for this visit.  Subjective Assessment - 08/06/19 0947    Subjective  HEP is going well.  I am back in gym and doing some running (1 mile) with a few twinges in knee and otherwise weight lifting.  5 miles on upright bike without pain.    Pertinent History  patellar dislocation in past, double jointed    How long can you sit comfortably?  1 hour    How long can you stand comfortably?  unlimited    How long can you walk comfortably?  unlimited    Diagnostic tests  no    Patient Stated Goals  get knee stronger    Currently in Pain?  No/denies                       [facility] Adult PT Treatment/Exercise - 08/06/19 0001      Exercises   Exercises  Knee/Hip      Knee/Hip Exercises: Standing   Hip Extension  Stengthening;Left;Right;Knee straight;10 reps    Extension Limitations  in plank arms on low table    SLS with Vectors  clock drill on Lt LE 3 way x 5 cycles    Other Standing Knee Exercises  monster walks fwd/bwd and sidestepping  circuit with blue band x 20' each      Knee/Hip Exercises: Seated   Other Seated Knee/Hip Exercises  long sitting SLR on Lt neutral and in ER each 2x10    Sit to Sand  10 reps;without UE support   as functional squat to black pad, blue band around knees     Knee/Hip Exercises: Supine   Bridges  Strengthening;Both;10 reps    Bridges Limitations  with march      Knee/Hip Exercises: Sidelying   Hip ADduction  Strengthening;Left;15 reps             PT Education - 08/06/19 1013    Education Details  Access Code: [email or web address]    Person(s) Educated  Patient    Methods  Explanation;Demonstration;Verbal cues;Handout    Comprehension  Verbalized understanding;Returned demonstration       PT Short Term Goals - 08/06/19 1255      PT SHORT TERM GOAL #1   Title  Pt will be ind in initial HEP to include proper form demo for sit to stand.    Status  Achieved  PT Long Term Goals - 08/06/19 1255      PT LONG TERM GOAL #1   Title  Pt will be ind in advanced HEP and understand how to safely progress    Baseline  progressing closed chain HEP    Status  On-going      PT LONG TERM GOAL #2   Title  Pt will achieve 5/5 for all hip and knee muscle groups to provide more dynamic support for Lt knee.    Status  On-going      PT LONG TERM GOAL #4   Title  Pt will demo proper knee control in squats and stairs to reduce risk of re-injury    Baseline  good for squats, assessing stairs next visit    Status  On-going            Plan - 08/06/19 1250    Clinical Impression Statement  Pt 15 min late for appt so short visit.  Pt has returned to gym and running 1 mile, 5 miles on upright bike and machines for strength with only occassional episode of mild Lt knee pain which doesn't last.  PT progressed HEP to include targeted open chain strength for medial knee and closed chain functional strength with resistance.  PT gave blue and black tband so Pt could self-progress from blue to black  when ready.  Pt with good performance of ther ex with good body awareness and little need for form cueing other than to avoid genu recurvatum locking of knee.  Pt will benefit from continued PT for guided progression of ther ex to improve stability and strength surrounding Lt knee and hip.    Comorbidities  obesity, prior injury of same knee    Rehab Potential  Excellent    PT Frequency  1x / week    PT Duration  8 weeks    PT Treatment/Interventions  ADLs/Self Care Home Management;Cryotherapy;Electrical Stimulation;Iontophoresis 4mg /ml Dexamethasone;Moist Heat;Gait training;Stair training;Functional mobility training;Therapeutic activities;Therapeutic exercise;Balance training;Neuromuscular re-education;Patient/family education;Manual techniques;Dry needling;Passive range of motion;Taping;Spinal Manipulations;Joint Manipulations    PT Next Visit Plan  RE-EVAL next visit, f/u on progression of HEP, progress to step ups/downs, lateral step ups, stand on foam/BOSU for LE strength    PT Home Exercise Plan  Access Code: [facility]    Recommended Other Services  cert is signed    Consulted and Agree with Plan of Care  Patient       Patient will benefit from skilled therapeutic intervention in order to improve the following deficits and impairments:     Visit Diagnosis: Acute pain of left knee  Muscle weakness (generalized)     Problem List Patient Active Problem List   Diagnosis Date Noted  . Class 3 severe obesity with serious comorbidity and body mass index (BMI) of 40.0 to 44.9 in adult [facility]) 07/12/2019  . Depression 07/12/2019  . Other hyperlipidemia 07/12/2019  . Morbid obesity with BMI of 40.0-44.9, adult ([place]) 06/12/2019    Baruch Merl, PT 08/06/19 12:58 PM   [facility] [address], [place], [place], [address] Phone: [phone]   Fax:  [phone]  Name: Kathleen Joyce MRN: [id number] Date of Birth:  [birth year]

## 2019-08-06 NOTE — Patient Instructions (Signed)
Access Code: [email or web address]  URL: [email or web address]  Date: 08/06/2019  Prepared by: Venetia Night Roniya Tetro   Exercises  Sit to Stand - 10 reps - 3 sets - 1x daily - 7x weekly  Straight Leg Raise - 10 reps - 3 sets - 1x daily - 7x weekly  Long Sitting Straight Leg Raise with External Rotation - 10 reps - 3 sets - 1x daily - 7x weekly  Sidelying Hip Abduction - 10 reps - 3 sets - 1x daily - 7x weekly  Sidelying Hip Adduction - 10 reps - 3 sets - 1x daily - 7x weekly  Forward Monster Walks - 10 reps - 3 sets - 1x daily - 7x weekly  Backward Monster Walks - 10 reps - 3 sets - 1x daily - 7x weekly  Side Stepping with Resistance at Thighs - 10 reps - 3 sets - 1x daily - 7x weekly  Star Taps - 10 reps - 3 sets - 1x daily - 7x weekly  Plank with Hip Extension on Counter - 10 reps - 3 sets - 1x daily - 7x weekly  Marching Bridge - 10 reps - 3 sets - 1x daily - 7x weekly

## 2019-08-08 ENCOUNTER — Other Ambulatory Visit: Payer: Self-pay

## 2019-08-08 ENCOUNTER — Encounter (INDEPENDENT_AMBULATORY_CARE_PROVIDER_SITE_OTHER): Payer: Self-pay | Admitting: Family Medicine

## 2019-08-08 ENCOUNTER — Telehealth (INDEPENDENT_AMBULATORY_CARE_PROVIDER_SITE_OTHER): Payer: BC Managed Care – PPO | Admitting: Family Medicine

## 2019-08-08 DIAGNOSIS — Z6841 Body Mass Index (BMI) 40.0 and over, adult: Secondary | ICD-10-CM

## 2019-08-08 DIAGNOSIS — D508 Other iron deficiency anemias: Secondary | ICD-10-CM | POA: Diagnosis not present

## 2019-08-08 DIAGNOSIS — F3289 Other specified depressive episodes: Secondary | ICD-10-CM | POA: Diagnosis not present

## 2019-08-08 DIAGNOSIS — E88819 Insulin resistance, unspecified: Secondary | ICD-10-CM

## 2019-08-08 DIAGNOSIS — E559 Vitamin D deficiency, unspecified: Secondary | ICD-10-CM

## 2019-08-08 DIAGNOSIS — E8881 Metabolic syndrome: Secondary | ICD-10-CM

## 2019-08-08 MED ORDER — FERROUS SULFATE 325 (65 FE) MG PO TABS: 325.0000 mg | ORAL_TABLET | Freq: Every day | ORAL | 0 refills | Status: DC

## 2019-08-08 MED ORDER — VITAMIN D (ERGOCALCIFEROL) 1.25 MG (50000 UNIT) PO CAPS: 50000.0000 [IU] | ORAL_CAPSULE | ORAL | 0 refills | Status: DC

## 2019-08-08 MED ORDER — BUPROPION HCL ER (SR) 150 MG PO TB12: 150.0000 mg | ORAL_TABLET | Freq: Every day | ORAL | 0 refills | Status: DC

## 2019-08-13 ENCOUNTER — Encounter: Payer: Self-pay | Admitting: Family Medicine

## 2019-08-13 ENCOUNTER — Encounter (INDEPENDENT_AMBULATORY_CARE_PROVIDER_SITE_OTHER): Payer: Self-pay | Admitting: Family Medicine

## 2019-08-13 DIAGNOSIS — F411 Generalized anxiety disorder: Secondary | ICD-10-CM | POA: Diagnosis not present

## 2019-08-13 DIAGNOSIS — F321 Major depressive disorder, single episode, moderate: Secondary | ICD-10-CM | POA: Diagnosis not present

## 2019-08-13 NOTE — Telephone Encounter (Signed)
Please review

## 2019-08-13 NOTE — Progress Notes (Signed)
Office: [phone]  /  Fax: [phone] TeleHealth Visit:  Kathleen Joyce has verbally consented to this TeleHealth visit today. The patient is located at home, the provider is located at the [facility] office. The participants in this visit include the listed provider and patient. The visit was conducted today via doxy.me (29 minutes).  HPI:  Chief Complaint: OBESITY Kathleen Joyce is here to discuss her progress with her obesity treatment plan. She is on the Category 2 plan and states she is following her eating plan approximately 80 % of the time. She states she is exercising at the gym for 60-90 minutes 3-4 times per week,  at home for 60 minutes 3-4 times per week, and doing physical therapy for 60 minutes 1 time per week.  Kathleen Joyce notes intermenstrual spotting. Her blood pressure was 119/79. She feels she has maintained her weight since her last visit. She had a virtual visit with [place] today due to COVID restrictions and weather concerns.  Vitamin D Deficiency Kathleen Joyce has a diagnosis of vitamin D deficiency. She is taking prescription Vit D.  Iron Deficiency Anemia Kathleen Joyce has a diagnosis of anemia. She is taking iron supplementation.  Lab Results  Component Value Date   WBC 7.3 07/11/2019   HGB 12.4 07/11/2019   HCT 39.0 07/11/2019   MCV 84 07/11/2019   PLT 224 07/11/2019   Lab Results  Component Value Date   IRON 21 (L) 07/11/2019   TIBC 315 07/11/2019   FERRITIN 11 (L) 07/11/2019   Depression with Emotional Eating Behaviors Kathleen Joyce recently started Wellbutrin. She had a visit with Dr. Mallie Mussel. She notes it is helping with energy and emotional eating.  ASSESSMENT AND PLAN:  Insulin resistance  Vitamin D deficiency - Plan: Vitamin D, Ergocalciferol, (DRISDOL) 1.25 MG (50000 UT) CAPS capsule  Other iron deficiency anemia - Plan: ferrous sulfate (FERROUSUL) 325 (65 FE) MG tablet  Other depression, emotional eating - Plan: buPROPion (WELLBUTRIN SR) 150 MG 12 hr  tablet  Class 3 severe obesity with serious comorbidity and body mass index (BMI) of 40.0 to 44.9 in adult, unspecified obesity type (HCC)  PLAN:  Vitamin D Deficiency Low vitamin D level contributes to fatigue and are associated with obesity, breast, and colon cancer. Kathleen Joyce agrees to continue taking prescription Vit D 50,000 IU every week #4 and we will refill for 1 month. She will follow up for routine testing of vitamin D, at least 2-3 times per year to avoid over-replacement. Kathleen Joyce agrees to follow up with our clinic in 3 weeks.  Iron Deficiency Anemia The diagnosis of Iron deficiency anemia was discussed with Kathleen Joyce. She was given suggestions of iron rich foods. Kathleen Joyce agrees to continue taking ferrous sulfate 325 mg PO q AM #30 and we will refill for 1 month. We will recheck labs at her future visit. Kathleen Joyce agrees to follow up with our clinic in 3 weeks.  Emotional Eating Behaviors (other depression) Behavior modification techniques were discussed today to help Kathleen Joyce deal with her emotional/non-hunger eating behaviors. Will continue to follow and monitor her progress. Kathleen Joyce agrees to continue taking Wellbutrin  SR 150 mg PO daily #30 and we will refill for 1 month. Kathleen Joyce agrees to follow-up with our clinic in 3 weeks.  Obesity Kathleen Joyce is currently in the action stage of change. As such, her goal is to continue with weight loss efforts. She has agreed to follow the Category 2 plan. Kathleen Joyce has been instructed to work up to a goal of 150  minutes of combined cardio and strengthening exercise per week for weight loss and overall health benefits. We discussed the following Behavioral Modification Strategies today: increasing lean protein intake, increase H20 intake, and celebration eating strategies.    Kathleen Joyce has agreed to follow up with our clinic in 3 weeks. She was informed of the importance of frequent follow up visits to maximize her success with intensive lifestyle modifications for her  multiple health conditions.  ALLERGIES: Allergies  Allergen Reactions  . Banana     MEDICATIONS: No current outpatient medications on file prior to visit.   No current facility-administered medications on file prior to visit.    PAST MEDICAL HISTORY: Past Medical History:  Diagnosis Date  . Anemia   . Depression 07/12/2019  . Left knee pain   . Other hyperlipidemia 07/12/2019  . Plantar fasciitis   . Right hip pain     PAST SURGICAL HISTORY: History reviewed. No pertinent surgical history.  SOCIAL HISTORY: Social History   Tobacco Use  . Smoking status: Former Research scientist (life sciences)  . Smokeless tobacco: Never Used  Substance Use Topics  . Alcohol use: Yes    Comment: rare  . Drug use: Not Currently    FAMILY HISTORY: Family History  Problem Relation Age of Onset  . Lupus Mother   . Kidney disease Mother   . Depression Mother   . Diverticulitis Father   . Depression Father   . Diabetes Maternal Aunt   . Breast cancer Maternal Aunt 67  . Cancer Paternal Grandfather        colon    ROS: Review of Systems  Constitutional: Negative for weight loss.  Psychiatric/Behavioral: Positive for depression.   PHYSICAL EXAM: Last menstrual period 07/23/2019. There is no height or weight on file to calculate BMI.   VITALS: Per patient if applicable, see vitals. GENERAL: Alert and in no acute distress. CARDIOPULMONARY: No increased WOB. Speaking in clear sentences.  PSYCH: Pleasant and cooperative. Speech normal rate and rhythm. Affect is appropriate. Insight and judgement are appropriate. Attention is focused, linear, and appropriate.  NEURO: Oriented as arrived to appointment on time with no prompting.   RECENT LABS AND TESTS: BMET    Component Value Date/Time   NA 139 07/11/2019 0935   K 4.6 07/11/2019 0935   CL 104 07/11/2019 0935   CO2 21 07/11/2019 0935   GLUCOSE 82 07/11/2019 0935   GLUCOSE 91 05/12/2018 0847   BUN 7 07/11/2019 0935   CREATININE 0.87 07/11/2019  0935   CALCIUM 9.1 07/11/2019 0935   GFRNONAA 89 07/11/2019 0935   GFRAA 103 07/11/2019 0935   Lab Results  Component Value Date   HGBA1C 5.4 07/11/2019   Lab Results  Component Value Date   INSULIN 24.1 07/11/2019   CBC    Component Value Date/Time   WBC 7.3 07/11/2019 0935   WBC 6.3 05/12/2018 0847   RBC 4.63 07/11/2019 0935   RBC 4.61 05/12/2018 0847   HGB 12.4 07/11/2019 0935   HCT 39.0 07/11/2019 0935   PLT 224 07/11/2019 0935   MCV 84 07/11/2019 0935   MCH 26.8 07/11/2019 0935   MCHC 31.8 07/11/2019 0935   MCHC 32.4 05/12/2018 0847   RDW 14.8 07/11/2019 0935   LYMPHSABS 2.4 07/11/2019 0935   MONOABS 0.8 05/31/2009 1415   EOSABS 0.1 07/11/2019 0935   BASOSABS 0.0 07/11/2019 0935   Iron/TIBC/Ferritin/ %Sat    Component Value Date/Time   IRON 21 (L) 07/11/2019 0935   TIBC 315 07/11/2019 0935  FERRITIN 11 (L) 07/11/2019 0935   IRONPCTSAT 7 (LL) 07/11/2019 0935   Lipid Panel     Component Value Date/Time   CHOL 159 07/11/2019 0935   TRIG 78 07/11/2019 0935   HDL 46 07/11/2019 0935   CHOLHDL 4 05/12/2018 0847   VLDL 14.6 05/12/2018 0847   LDLCALC 98 07/11/2019 0935   Hepatic Function Panel     Component Value Date/Time   PROT 7.5 07/11/2019 0935   ALBUMIN 3.6 (L) 07/11/2019 0935   AST 15 07/11/2019 0935   ALT 16 07/11/2019 0935   ALKPHOS 56 07/11/2019 0935   BILITOT <0.2 07/11/2019 0935      Component Value Date/Time   TSH 3.180 07/11/2019 0935   I, Trixie Dredge, am acting as transcriptionist for Briscoe Deutscher, DO  I have reviewed the above documentation for accuracy and completeness, and I agree with the above. Briscoe Deutscher, DO

## 2019-08-14 ENCOUNTER — Ambulatory Visit: Payer: BC Managed Care – PPO | Admitting: Physical Therapy

## 2019-08-14 ENCOUNTER — Other Ambulatory Visit: Payer: Self-pay

## 2019-08-14 ENCOUNTER — Encounter: Payer: Self-pay | Admitting: Physical Therapy

## 2019-08-14 DIAGNOSIS — M25562 Pain in left knee: Secondary | ICD-10-CM

## 2019-08-14 DIAGNOSIS — M6281 Muscle weakness (generalized): Secondary | ICD-10-CM

## 2019-08-14 NOTE — Therapy (Addendum)
[facility] [address] [place], [place], [address] Phone: [phone]   Fax:  [phone]  Physical Therapy Treatment  Patient Details  Name: Kathleen Joyce MRN: [id number] Date of Birth: [birth year] Referring Provider (PT): Martinique, Betty G, MD   Encounter Date: 08/14/2019  PT End of Session - 08/14/19 1454    Visit Number  3    Date for PT Re-Evaluation  08/20/19    Authorization Type  [employer]    PT Start Time  2202    PT Stop Time  1527    PT Time Calculation (min)  42 min    Activity Tolerance  Patient tolerated treatment well    Behavior During Therapy  [facility] for tasks assessed/performed       Past Medical History:  Diagnosis Date  . Anemia   . Depression 07/12/2019  . Left knee pain   . Other hyperlipidemia 07/12/2019  . Plantar fasciitis   . Right hip pain     History reviewed. No pertinent surgical history.  There were no vitals filed for this visit.  Subjective Assessment - 08/14/19 1452    Subjective  HEP is going very well.  I had some medial knee pain one night (burning) and was able to push on my kneecap from inside to outside and it went away.  Otherwise doing great.    Pertinent History  patellar dislocation in past, double jointed    Limitations  Other (comment);Sitting    How long can you sit comfortably?  unlimited    How long can you stand comfortably?  unlimited    How long can you walk comfortably?  unlimited    Diagnostic tests  no    Patient Stated Goals  get knee stronger    Currently in Pain?  No/denies         [facility] PT Assessment - 08/14/19 0001      Assessment   Medical Diagnosis  M25.562 (ICD-10-CM) - Left knee pain, unspecified chronicity    Referring Provider (PT)  Martinique, Betty G, MD    Onset Date/Surgical Date  --   approx 2-3 weeks ago, with history of prior injury 2 years a   Next MD Visit  no    Prior Therapy  yes      Observation/Other Assessments   Focus on  Therapeutic Outcomes (FOTO)   37%      Functional Tests   Functional tests  Squat;Step down      Squat   Comments  good control, no pain      Step Down   Comments  good control, mild valgus, no pain      Posture/Postural Control   Posture Comments  genu recurvatum but Pt able to control to neutral      AROM   Overall AROM Comments  no pain with ROM    Right Knee Extension  -5   hypermobile   Right Knee Flexion  140    Left Knee Extension  -3   hypermobile   Left Knee Flexion  140      Strength   Overall Strength Comments  non painful with testing    Left Hip Flexion  4+/5    Left Hip Extension  4+/5    Left Hip External Rotation  4+/5    Left Hip Internal Rotation  5/5    Left Hip ABduction  4/5    Left Hip ADduction  4+/5    Left Knee  Flexion  5/5    Left Knee Extension  5/5      Palpation   Palpation comment  medial joint line non-tender, bil adductors mild tenderness                   [facility] Adult PT Treatment/Exercise - 08/14/19 0001      Knee/Hip Exercises: Standing   Step Down  Both;2 sets;5 sets;Step Height: 4"    Step Down Limitations  Pt cued fold at hips    Other Standing Knee Exercises  BOSU and foam pad standing with 10# bil UE weights shoulder flex, overhead press, bicep curls (on foam, balance and arm motions only on BOSU)    Other Standing Knee Exercises  hip 4-way x 10 reps blue band single hand hold on ski pole, PT cued don't allow pelvic rotation during hip ext               PT Short Term Goals - 08/06/19 1255      PT SHORT TERM GOAL #1   Title  Pt will be ind in initial HEP to include proper form demo for sit to stand.    Status  Achieved        PT Long Term Goals - 08/14/19 1456      PT LONG TERM GOAL #1   Title  Pt will be ind in advanced HEP and understand how to safely progress    Baseline  progressing closed chain dynamic HEP    Status  On-going      PT LONG TERM GOAL #2   Title  Pt will achieve 5/5 for all  hip and knee muscle groups to provide more dynamic support for Lt knee.      PT LONG TERM GOAL #3   Title  Pt will achieve FOTO score of </= 23% to demo less limitation.    Baseline  39% baseline, today 37%    Status  On-going      PT LONG TERM GOAL #4   Title  Pt will demo proper knee control in squats and stairs to reduce risk of re-injury            Plan - 08/14/19 1733    Clinical Impression Statement  Pt making great progress with strength and HEP.  She has had several brief episodes of medial Lt knee pain but they do not last.  She has returned to the gym and is very compliant with HEP.  Pt continues to develop progression of dynamic open and closed chain exercises for Pt.  PT provided VC and demo for heel touch step downs to emphasize hip flexion to avoid knee strain.  Progressed hip strength to 4-way hip in standing with blue band and cued stance leg stability awareness.  Pt will benefit from PT every other week to assess response to HEP and readiness for further progression to ensure proper challenge, form and prevention of future injury as she returns to full activity level and exercise.    Comorbidities  obesity, prior injury of same knee    Stability/Clinical Decision Making  Stable/Uncomplicated    Clinical Decision Making  Low    Rehab Potential  Excellent    PT Frequency  Biweekly    PT Duration  8 weeks    PT Treatment/Interventions  ADLs/Self Care Home Management;Cryotherapy;Electrical Stimulation;Iontophoresis 64m/ml Dexamethasone;Moist Heat;Gait training;Stair training;Functional mobility training;Therapeutic activities;Therapeutic exercise;Balance training;Neuromuscular re-education;Patient/family education;Manual techniques;Dry needling;Passive range of motion;Taping;Spinal Manipulations;Joint Manipulations    PT Next  Visit Plan  f/u on progression of HEP, progress BOSU UE weights and attempt squat, try static lunge, plank and bridge challenges (feet on ball)    PT  Home Exercise Plan  Access Code: [email or web address]    Recommended Other Services  is re-cert signed?    Consulted and Agree with Plan of Care  Patient       Patient will benefit from skilled therapeutic intervention in order to improve the following deficits and impairments:  Obesity, Pain, Hypermobility, Improper body mechanics, Postural dysfunction, Decreased strength  Visit Diagnosis: Acute pain of left knee - Plan: PT plan of care cert/re-cert  Muscle weakness (generalized) - Plan: PT plan of care cert/re-cert     Problem List Patient Active Problem List   Diagnosis Date Noted  . Class 3 severe obesity with serious comorbidity and body mass index (BMI) of 40.0 to 44.9 in adult [facility]) 07/12/2019  . Depression 07/12/2019  . Other hyperlipidemia 07/12/2019  . Morbid obesity with BMI of 40.0-44.9, adult ([facility]) 06/12/2019    Leia Coletti, PT 08/14/19 5:40 PM  PHYSICAL THERAPY DISCHARGE SUMMARY  Visits from Start of Care: 3  Current functional level related to goals / functional outcomes: See above   Remaining deficits: See above  Education / Equipment: HEP Plan: Patient agrees to discharge.  Patient goals were partially met. Patient is being discharged due to not returning since the last visit.  ?????         Baruch Merl, PT 10/18/19 1:38 PM   [facility] [address] [place], [place], [address] Phone: [phone]   Fax:  [phone]  Name: Kathleen Joyce MRN: [id number] Date of Birth: 01-May-1986

## 2019-08-15 DIAGNOSIS — Z20828 Contact with and (suspected) exposure to other viral communicable diseases: Secondary | ICD-10-CM | POA: Diagnosis not present

## 2019-08-20 DIAGNOSIS — F321 Major depressive disorder, single episode, moderate: Secondary | ICD-10-CM | POA: Diagnosis not present

## 2019-08-20 DIAGNOSIS — F411 Generalized anxiety disorder: Secondary | ICD-10-CM | POA: Diagnosis not present

## 2019-08-22 ENCOUNTER — Encounter: Payer: Self-pay | Admitting: Women's Health

## 2019-08-22 ENCOUNTER — Telehealth: Payer: Self-pay | Admitting: *Deleted

## 2019-08-22 ENCOUNTER — Other Ambulatory Visit: Payer: Self-pay

## 2019-08-22 ENCOUNTER — Ambulatory Visit: Payer: BC Managed Care – PPO | Admitting: Women's Health

## 2019-08-22 VITALS — BP 122/80

## 2019-08-22 DIAGNOSIS — N938 Other specified abnormal uterine and vaginal bleeding: Secondary | ICD-10-CM | POA: Diagnosis not present

## 2019-08-22 LAB — PREGNANCY, URINE: Preg Test, Ur: NEGATIVE

## 2019-08-22 MED ORDER — MEDROXYPROGESTERONE ACETATE 10 MG PO TABS: 10.0000 mg | ORAL_TABLET | Freq: Every day | ORAL | 0 refills | Status: DC

## 2019-08-22 NOTE — Progress Notes (Signed)
33 year old engaged BF G0 presents with extremely heavy cycle with large clots questionable passage of tissue with increased cramping for the past 5 days which is a change.  Reports always regular monthly heavy cycles. Normal cycle November 30 to December 5, December 12 had nausea with vomiting,  negative home UPT on 08/06/2019.  December 15 spotted and has had heavy vaginal bleeding with clots since December 26.  Passed a large clot with questionable tissue 2 days ago and  changing protection every hour and does not seem to be decreasing in amount.  Medical problems include anxiety/depression, vitamin D deficiency, anemia, obesity is currently working with bariatric doctor to help with diet.  Denies vaginal discharge with irritation, odor or itching, urinary symptoms, change in bowel elimination, back pain or fever.  Denies change in routine.  Same partner negative STD screen.  Using no contraception, pregnancy okay, marriage planned August 22, 2020.  Exam: Appears well.  No CVAT.  Abdomen soft, obese, nontender, external genitalia within normal limits, fully saturated tampon removed reports in less than 1 hour, speculum exam copious menses with clots, bimanual no CMT or tenderness with exam.  Reports some increased cramping after exam. UPT negative  Menorrhagia  Plan: Options discussed, Provera 10 mg p.o. daily for 10 days instructed to call if bleeding persists.  Reviewed if continued issues with menorrhagia, spotting will proceed with sonohysterogram with Dr. Dellis Filbert overdue for annual instructed to schedule.  Prenatal vitamin with iron daily encouraged.

## 2019-08-22 NOTE — Telephone Encounter (Signed)
Patient called concerned about possible missed ab. Patient said LMP:07/23/19-08/07/19,had sexual intercourse thanksgiving holiday, Dec.12 noticed cramping , vomiting, cramping and stomach pains. Took UPT was negative, then report cycle in Dec came 1 week earlier heavy bleeding with clots and strange amount of tissue, bleeding heavy, changing pads every 2-3 hours. Office visit scheduled today at 2:00p with Izora Gala. Covid questions answered.

## 2019-08-27 DIAGNOSIS — F321 Major depressive disorder, single episode, moderate: Secondary | ICD-10-CM | POA: Diagnosis not present

## 2019-08-27 DIAGNOSIS — F411 Generalized anxiety disorder: Secondary | ICD-10-CM | POA: Diagnosis not present

## 2019-08-27 NOTE — Progress Notes (Unsigned)
  Office: [phone]  /  Fax: [phone]    Date: August 30, 2019   Time Seen:*** Duration: *** minutes Provider: Glennie Isle, Psy.D. Type of Session: Individual Therapy  Type of Contact: Face-to-face  Session Content: Kathleen Joyce is a 34 y.o. female presenting for a follow-up appointment to address the previously established treatment goal of decreasing emotional eating. The session was initiated with the administration of the PHQ-9 and GAD-7, as well as a brief check-in. *** Kathleen Joyce was receptive to today's appointment as evidenced by openness to sharing, responsiveness to feedback, and {gbreceptiveness:23401}.  Mental Status Examination:  Appearance: {Appearance:22431} Behavior: {Behavior:22445} Mood: {gbmood:21757} Affect: {Affect:22436} Speech: {Speech:22432} Eye Contact: {Eye Contact:22433} Psychomotor Activity: {Motor Activity:22434} Gait: {gbgait:23404} Thought Process: {thought process:22448}  Thought Content/Perception: {disturbances:22451} Orientation: {Orientation:22437} Memory/Concentration: {gbcognition:22449} Insight/Judgment: {Insight:22446}  Structured Assessments Results: The Patient Health Questionnaire-9 (PHQ-9) is a self-report measure that assesses symptoms and severity of depression over the course of the last two weeks. Kathleen Joyce obtained a score of *** suggesting {GBPHQ9SEVERITY:21752}. Kathleen Joyce finds the endorsed symptoms to be {gbphq9difficulty:21754}. [0= Not at all; 1= Several days; 2= More than half the days; 3= Nearly every day] Little interest or pleasure in doing things ***  Feeling down, depressed, or hopeless ***  Trouble falling or staying asleep, or sleeping too much ***  Feeling tired or having little energy ***  Poor appetite or overeating ***  Feeling bad about yourself --- or that you are a failure or have let yourself or your family down ***  Trouble concentrating on things, such as reading the newspaper or watching television ***  Moving or  speaking so slowly that other people could have noticed? Or the opposite --- being so fidgety or restless that you have been moving around a lot more than usual ***  Thoughts that you would be better off dead or hurting yourself in some way ***  PHQ-9 Score ***    The Generalized Anxiety Disorder-7 (GAD-7) is a brief self-report measure that assesses symptoms of anxiety over the course of the last two weeks. Kathleen Joyce obtained a score of *** suggesting {gbgad7severity:21753}. Kathleen Joyce finds the endorsed symptoms to be {gbphq9difficulty:21754}. [0= Not at all; 1= Several days; 2= Over half the days; 3= Nearly every day] Feeling nervous, anxious, on edge ***  Not being able to stop or control worrying ***  Worrying too much about different things ***  Trouble relaxing ***  Being so restless that it's hard to sit still ***  Becoming easily annoyed or irritable ***  Feeling afraid as if something awful might happen ***  GAD-7 Score ***   Interventions:  {Interventions for Progress Notes:23405}  DSM-5 Diagnosis: 311 (F32.8) Other Specified Depressive Disorder, Emotional Eating Behaviors  Treatment Goal & Progress: During the initial appointment with this provider, the following treatment goal was established: decrease emotional eating. Kathleen Joyce has demonstrated progress in her goal as evidenced by {gbtxprogress:22839}. Kathleen Joyce also {gbtxprogress2:22951}.  Plan: The next appointment will be scheduled in {gbweeks:21758}. The next session will focus on {Plan for Next Appointment:23400}.

## 2019-08-28 ENCOUNTER — Ambulatory Visit (INDEPENDENT_AMBULATORY_CARE_PROVIDER_SITE_OTHER): Payer: BC Managed Care – PPO | Admitting: Family Medicine

## 2019-08-28 ENCOUNTER — Other Ambulatory Visit: Payer: Self-pay

## 2019-08-28 VITALS — BP 120/84 | HR 77 | Temp 98.2°F | Ht 70.0 in | Wt 282.0 lb

## 2019-08-28 DIAGNOSIS — D508 Other iron deficiency anemias: Secondary | ICD-10-CM | POA: Diagnosis not present

## 2019-08-28 DIAGNOSIS — E559 Vitamin D deficiency, unspecified: Secondary | ICD-10-CM

## 2019-08-28 DIAGNOSIS — N938 Other specified abnormal uterine and vaginal bleeding: Secondary | ICD-10-CM

## 2019-08-28 DIAGNOSIS — F3289 Other specified depressive episodes: Secondary | ICD-10-CM

## 2019-08-28 DIAGNOSIS — Z9189 Other specified personal risk factors, not elsewhere classified: Secondary | ICD-10-CM | POA: Diagnosis not present

## 2019-08-28 DIAGNOSIS — R5383 Other fatigue: Secondary | ICD-10-CM

## 2019-08-28 DIAGNOSIS — Z6841 Body Mass Index (BMI) 40.0 and over, adult: Secondary | ICD-10-CM

## 2019-08-28 DIAGNOSIS — E66813 Obesity, class 3: Secondary | ICD-10-CM

## 2019-08-29 LAB — ANEMIA PANEL
Ferritin: 27 ng/mL (ref 15–150)
Folate, Hemolysate: 307 ng/mL
Folate, RBC: 837 ng/mL (ref >498)
Hematocrit: 36.7 % (ref 34.0–46.6)
Iron Saturation: 23 % (ref 15–55)
Iron: 71 ug/dL (ref 27–159)
Retic Ct Pct: 1 % (ref 0.6–2.6)
Total Iron Binding Capacity: 314 ug/dL (ref 250–450)
UIBC: 243 ug/dL (ref 131–425)
Vitamin B-12: 454 pg/mL (ref 232–1245)

## 2019-08-29 LAB — COMPREHENSIVE METABOLIC PANEL
ALT: 16 IU/L (ref 0–32)
AST: 12 IU/L (ref 0–40)
Albumin/Globulin Ratio: 1.1 — ABNORMAL LOW (ref 1.2–2.2)
Albumin: 3.8 g/dL (ref 3.8–4.8)
Alkaline Phosphatase: 53 IU/L (ref 39–117)
BUN/Creatinine Ratio: 11 (ref 9–23)
BUN: 8 mg/dL (ref 6–20)
Bilirubin Total: <0.2 mg/dL (ref 0.0–1.2)
CO2: 19 mmol/L — ABNORMAL LOW (ref 20–29)
Calcium: 9 mg/dL (ref 8.7–10.2)
Chloride: 105 mmol/L (ref 96–106)
Creatinine, Ser: 0.73 mg/dL (ref 0.57–1.00)
GFR calc Af Amer: 126 mL/min/{1.73_m2} (ref >59)
GFR calc non Af Amer: 109 mL/min/{1.73_m2} (ref >59)
Globulin, Total: 3.6 g/dL (ref 1.5–4.5)
Glucose: 82 mg/dL (ref 65–99)
Potassium: 4.3 mmol/L (ref 3.5–5.2)
Sodium: 138 mmol/L (ref 134–144)
Total Protein: 7.4 g/dL (ref 6.0–8.5)

## 2019-08-29 LAB — CBC WITH DIFFERENTIAL/PLATELET
Basophils Absolute: 0 10*3/uL (ref 0.0–0.2)
Basos: 0 %
EOS (ABSOLUTE): 0.1 10*3/uL (ref 0.0–0.4)
Eos: 1 %
Hemoglobin: 12.4 g/dL (ref 11.1–15.9)
Immature Grans (Abs): 0 10*3/uL (ref 0.0–0.1)
Immature Granulocytes: 0 %
Lymphocytes Absolute: 2.2 10*3/uL (ref 0.7–3.1)
Lymphs: 30 %
MCH: 27.9 pg (ref 26.6–33.0)
MCHC: 33.8 g/dL (ref 31.5–35.7)
MCV: 83 fL (ref 79–97)
Monocytes Absolute: 0.5 10*3/uL (ref 0.1–0.9)
Monocytes: 6 %
Neutrophils Absolute: 4.6 10*3/uL (ref 1.4–7.0)
Neutrophils: 63 %
Platelets: 253 10*3/uL (ref 150–450)
RBC: 4.45 x10E6/uL (ref 3.77–5.28)
RDW: 15.1 % (ref 11.7–15.4)
WBC: 7.3 10*3/uL (ref 3.4–10.8)

## 2019-08-29 LAB — CALCIUM, IONIZED: Calcium, Ion: 5.1 mg/dL (ref 4.5–5.6)

## 2019-08-29 LAB — T3: T3, Total: 100 ng/dL (ref 71–180)

## 2019-08-29 LAB — BETA HCG QUANT (REF LAB): hCG Quant: <1 m[IU]/mL

## 2019-08-29 LAB — PROTIME-INR
INR: 1 (ref 0.9–1.2)
Prothrombin Time: 10.7 s (ref 9.1–12.0)

## 2019-08-29 LAB — T4, FREE: Free T4: 0.95 ng/dL (ref 0.82–1.77)

## 2019-08-29 LAB — TSH: TSH: 3.1 u[IU]/mL (ref 0.450–4.500)

## 2019-08-29 NOTE — Progress Notes (Signed)
Chief Complaint: OBESITY Kathleen Joyce is here to discuss her progress with her obesity treatment plan along with follow-up of her obesity related diagnoses. Kathleen Joyce is on the Category 2 Plan and states she is following her eating plan approximately 60% of the time. Kathleen Joyce states she is doing strength training, using weights, and going to the gym for 90 minutes 3 times per week.  Today's visit was #: 4 Starting weight: 284 lbs Starting date: 07/11/2019 Today's weight: 282 lbs Today's date: 08/29/2019 Total lbs lost to date: 2 lbs Total lbs lost since last in-office visit: 1 lb  Interim History: Kathleen Joyce says she feels she is following the plan fairly well.  She is planning a wedding for NYE 2021.  She has a dress fitting in June.  Unfortunately, she had abnormal vaginal bleeding and pelvic pain throughout December, which prompted her to see her gynecologist.  I have reviewed that note.  She is currently taking Provera.  Subjective:   1. Vitamin D deficiency Kathleen Joyce has a diagnosis of vitamin D deficiency. She is currently taking vit D. She denies nausea, vomiting or muscle weakness.  2. Other iron deficiency anemia Kathleen Joyce had menorrhagia and saw her GYN on 08/22/2019.  Her ferritin level was 11 on 07/11/2019.   3. Other depression, emotional eating Kathleen Joyce is struggling with emotional eating and using food for comfort to the extent that it is negatively impacting her health. She often snacks when she is not hungry. Kathleen Joyce sometimes feels she is out of control and then feels guilty that she made poor food choices. She has been working on behavior modification techniques to help reduce her emotional eating. She shows no sign of suicidal or homicidal ideations.  4. At high risk for anemia Will check labs today given report of increased vaginal bleeding throughout the month of December.  Assessment/Plan:   1. Vitamin D deficiency Low Vitamin D level contributes to fatigue and are associated  with obesity, breast, and colon cancer. She agrees to continue to take prescription Vitamin D @50 ,000 IU every week and will follow-up for routine testing of vitamin D, at least 2-3 times per year to avoid over-replacement.  Orders - Vitamin D, Ergocalciferol, (DRISDOL) 1.25 MG (50000 UT) CAPS capsule; Take 1 capsule (50,000 Units total) by mouth every 7 (seven) days.  Dispense: 4 capsule; Refill: 0  2. Other iron deficiency anemia Patient was diagnosed with menorrhagia and prescribed Provera.  Orders and follow up as documented in patient record. Advised to schedule follow-up with GYN.   Orders - CBC w/Diff/Platelet - Anemia panel - INR/PT  3. Other depression, emotional eating Current treatment plan is effective, no change in therapy.  Behavior modification techniques were discussed today to help Kathleen Joyce deal with her emotional/non-hunger eating behaviors.  Orders and follow up as documented in patient record.  Wellbutrin is working well for her and will be continued.  4. At high risk for anemia Kathleen Joyce had a low ferritin level of 11 in November and then had increased bleeding through the month of December.  She also had some increased fatigue.  Will check labs today.  Red flags have been reviewed.  5. DUB History of DUB x 1 month. Feeling more fatigued. Will obtain labs and send a message to her GYN.   6. Class 3 severe obesity with serious comorbidity and body mass index (BMI) of 40.0 to 44.9 in adult, unspecified obesity type (Kathleen Joyce) Kathleen Joyce is currently in the action stage of change. As  such, her goal is to continue with weight loss efforts. She has agreed to Category 2 Plan.   We discussed the following exercise goals today: For substantial health benefits, adults should do at least 150 minutes (2 hours and 30 minutes) a week of moderate-intensity, or 75 minutes (1 hour and 15 minutes) a week of vigorous-intensity aerobic physical activity, or an equivalent combination of moderate- and  vigorous-intensity aerobic activity. Aerobic activity should be performed in episodes of at least 10 minutes, and preferably, it should be spread throughout the week. Adults should also include muscle-strengthening activities that involve all major muscle groups on 2 or more days a week.  Kathleen Joyce has agreed to follow-up with our clinic in 2 weeks. She was informed of the importance of frequent follow-up visits to maximize her success with intensive lifestyle modifications for her multiple health conditions.  Objective:   Blood pressure 120/84, pulse 77, temperature 98.2 F (36.8 C), temperature source Oral, height 5\' 10"  (1.778 m), weight 282 lb (127.9 kg), SpO2 99 %. Body mass index is 40.46 kg/m.  General: Cooperative, alert, well developed, in no acute distress. HEENT: Conjunctivae and lids unremarkable. Neck: No thyromegaly.  Cardiovascular: Regular rhythm.  Lungs: Normal work of breathing. Extremities: No edema.  Neurologic: No focal deficits.   Lab Results  Component Value Date   CREATININE 0.73 08/28/2019   BUN 8 08/28/2019   NA 138 08/28/2019   K 4.3 08/28/2019   CL 105 08/28/2019   CO2 19 (L) 08/28/2019   Lab Results  Component Value Date   ALT 16 08/28/2019   AST 12 08/28/2019   ALKPHOS 53 08/28/2019   BILITOT <0.2 08/28/2019   Lab Results  Component Value Date   HGBA1C 5.4 07/11/2019   Lab Results  Component Value Date   INSULIN 24.1 07/11/2019   Lab Results  Component Value Date   TSH 3.100 08/28/2019   Lab Results  Component Value Date   CHOL 159 07/11/2019   HDL 46 07/11/2019   LDLCALC 98 07/11/2019   TRIG 78 07/11/2019   CHOLHDL 4 05/12/2018   Lab Results  Component Value Date   WBC 7.3 08/28/2019   HGB 12.4 08/28/2019   HCT 36.7 08/28/2019   MCV 83 08/28/2019   PLT 253 08/28/2019   Lab Results  Component Value Date   IRON 71 08/28/2019   TIBC 314 08/28/2019   FERRITIN 27 08/28/2019   Attestation Statements:   Reviewed by  clinician on day of visit: allergies, medications, problem list, medical history, surgical history, family history, social history and previous encounter notes.  This visit occurred during the SARS-CoV-2 public health emergency. Safety protocols were in place, including screening questions prior to the visit, additional usage of staff PPE, and extensive cleaning of exam room while observing appropriate contact time as indicated for disinfecting solutions. (CPT [id number])  I, Water quality scientist, CMA, am acting as transcriptionist for [employer], DO.  I have reviewed the above documentation for accuracy and completeness, and I agree with the above. Briscoe Deutscher, DO

## 2019-08-30 ENCOUNTER — Ambulatory Visit (INDEPENDENT_AMBULATORY_CARE_PROVIDER_SITE_OTHER): Payer: BC Managed Care – PPO | Admitting: Psychology

## 2019-08-30 ENCOUNTER — Other Ambulatory Visit: Payer: Self-pay

## 2019-08-31 ENCOUNTER — Encounter (INDEPENDENT_AMBULATORY_CARE_PROVIDER_SITE_OTHER): Payer: Self-pay | Admitting: Family Medicine

## 2019-08-31 MED ORDER — VITAMIN D (ERGOCALCIFEROL) 1.25 MG (50000 UNIT) PO CAPS: 50000.0000 [IU] | ORAL_CAPSULE | ORAL | 0 refills | Status: DC

## 2019-09-03 DIAGNOSIS — F321 Major depressive disorder, single episode, moderate: Secondary | ICD-10-CM | POA: Diagnosis not present

## 2019-09-03 DIAGNOSIS — F411 Generalized anxiety disorder: Secondary | ICD-10-CM | POA: Diagnosis not present

## 2019-09-04 ENCOUNTER — Other Ambulatory Visit: Payer: Self-pay

## 2019-09-05 ENCOUNTER — Ambulatory Visit (INDEPENDENT_AMBULATORY_CARE_PROVIDER_SITE_OTHER): Payer: BC Managed Care – PPO | Admitting: Obstetrics & Gynecology

## 2019-09-05 ENCOUNTER — Encounter: Payer: Self-pay | Admitting: Obstetrics & Gynecology

## 2019-09-05 VITALS — BP 126/82 | Ht 70.0 in | Wt 284.0 lb

## 2019-09-05 DIAGNOSIS — N921 Excessive and frequent menstruation with irregular cycle: Secondary | ICD-10-CM | POA: Diagnosis not present

## 2019-09-05 DIAGNOSIS — Z6841 Body Mass Index (BMI) 40.0 and over, adult: Secondary | ICD-10-CM

## 2019-09-05 DIAGNOSIS — Z01419 Encounter for gynecological examination (general) (routine) without abnormal findings: Secondary | ICD-10-CM | POA: Diagnosis not present

## 2019-09-05 DIAGNOSIS — Z30015 Encounter for initial prescription of vaginal ring hormonal contraceptive: Secondary | ICD-10-CM

## 2019-09-05 MED ORDER — ETONOGESTREL-ETHINYL ESTRADIOL 0.12-0.015 MG/24HR VA RING: 1.0000 | VAGINAL_RING | VAGINAL | 4 refills | Status: DC

## 2019-09-05 NOTE — Progress Notes (Signed)
Kathleen Joyce May 23, 1986 [id number]   History:    34 y.o. G0 Engaged.  Wedding planned for 08/22/2020.  RP: Established patient presenting for annual gyn exam   HPI:  Menses heavy and irregular.  Had a menstrual period 07/23/19 which lasted 16 days with Nausea/discomfort.  Took a HPT which was Negative.  LMP 08/18/2019 was heavy with blood clots and tissue.  UPT Neg 08/22/2019 and Quant HCG Negative 08/28/2019. Thyroid panel normal, Hb normal at 12.4, Iron panel normal, PT/INR normal and  CMP wnl on 08/28/2019.  Menstrual periods were more regular when her weight was lower.  Planning her wedding 08/22/2020.  Until then, would like to loose weight and improve her fitness in preparation for conception. Regained some of the weight she had lost last year.  BMI 40.75 now (BMI 39.31 last year).  No pelvic pain.  Normal vaginal secretions..  Pap Negative, HPV HR Negative, Gono-Chlam Negative 06/2018.  Breasts normal.  Had a Bilateral Dx Mammo that was benign 06/2018.  Previous visit on 08/22/2019 with Elon Alas NP caused emotional distress to patient, all concerns about that visit addressed today.  Past medical history,surgical history, family history and social history were all reviewed and documented in the EPIC chart.  Gynecologic History Patient's last menstrual period was 08/18/2019.  Obstetric History OB History  Gravida Para Term Preterm AB Living  0 0 0 0 0 0  SAB TAB Ectopic Multiple Live Births  0 0 0 0 0     ROS: A ROS was performed and pertinent positives and negatives are included in the history.  GENERAL: No fevers or chills. HEENT: No change in vision, no earache, sore throat or sinus congestion. NECK: No pain or stiffness. CARDIOVASCULAR: No chest pain or pressure. No palpitations. PULMONARY: No shortness of breath, cough or wheeze. GASTROINTESTINAL: No abdominal pain, nausea, vomiting or diarrhea, melena or bright red blood per rectum. GENITOURINARY: No urinary frequency,  urgency, hesitancy or dysuria. MUSCULOSKELETAL: No joint or muscle pain, no back pain, no recent trauma. DERMATOLOGIC: No rash, no itching, no lesions. ENDOCRINE: No polyuria, polydipsia, no heat or cold intolerance. No recent change in weight. HEMATOLOGICAL: No anemia or easy bruising or bleeding. NEUROLOGIC: No headache, seizures, numbness, tingling or weakness. PSYCHIATRIC: No depression, no loss of interest in normal activity or change in sleep pattern.     Exam:   BP 126/82   Ht 5\' 10"  (1.778 m)   Wt 284 lb (128.8 kg)   LMP 08/18/2019   BMI 40.75 kg/m   Body mass index is 40.75 kg/m.  General appearance : Well developed well nourished female. No acute distress HEENT: Eyes: no retinal hemorrhage or exudates,  Neck supple, trachea midline, no carotid bruits, no thyroidmegaly Lungs: Clear to auscultation, no rhonchi or wheezes, or rib retractions  Heart: Regular rate and rhythm, no murmurs or gallops Breast:Examined in sitting and supine position were symmetrical in appearance, no palpable masses or tenderness,  no skin retraction, no nipple inversion, no nipple discharge, no skin discoloration, no axillary or supraclavicular lymphadenopathy Abdomen: no palpable masses or tenderness, no rebound or guarding Extremities: no edema or skin discoloration or tenderness  Pelvic: Vulva: Normal             Vagina: No gross lesions or discharge  Cervix: No gross lesions or discharge  Uterus  AV, normal size, shape and consistency, non-tender and mobile  Adnexa  Without masses or tenderness  Anus: Normal   Assessment/Plan:  34 y.o.  female for annual exam   1. Well female exam with routine gynecological exam Normal gynecologic exam.  Pap test negative in November 2019, no indication to repeat today.  Breast exam normal.  Bilateral diagnostic mammogram benign in November 2019.  CBC, CMP, thyroid panel and iron panel within normal limits in January 2021.  2. Encounter for initial  prescription of vaginal ring hormonal contraceptive Contraception counseling done.  Decision to start on NuvaRing.  No contraindication.  Usage reviewed.  Prescription sent to pharmacy.  3. Menometrorrhagia Recent menometrorrhagia without documentation of pregnancy/early miscarriage, home pregnancy test, urine pregnancy test and quant beta hCG all negative.  Will attempt control with the Nuvaring and complete the investigation with a pelvic ultrasound at follow-up. - US Transvaginal Non-OB; Future  4. Class 3 severe obesity due to excess calories without serious comorbidity with body mass index (BMI) of 40.0 to 44.9 in adult [facility]) Recommend a lower calorie/carb diet such as [employer].  May try intermittent fasting.  Aerobic physical activities recommended 5 times a week with light weightlifting every 2 days.  Other orders - etonogestrel-ethinyl estradiol (NUVARING) 0.12-0.015 MG/24HR vaginal ring; Place 1 each vaginally every 28 (twenty-eight) days. Insert vaginally and leave in place for 3 consecutive weeks, then remove for 1 week.  Princess Bruins MD, 3:57 PM 09/05/2019

## 2019-09-08 ENCOUNTER — Encounter: Payer: Self-pay | Admitting: Obstetrics & Gynecology

## 2019-09-08 NOTE — Patient Instructions (Signed)
1. Well female exam with routine gynecological exam Normal gynecologic exam.  Pap test negative in November 2019, no indication to repeat today.  Breast exam normal.  Bilateral diagnostic mammogram benign in November 2019.  CBC, CMP, thyroid panel and iron panel within normal limits in January 2021.  2. Encounter for initial prescription of vaginal ring hormonal contraceptive Contraception counseling done.  Decision to start on NuvaRing.  No contraindication.  Usage reviewed.  Prescription sent to pharmacy.  3. Menometrorrhagia Recent menometrorrhagia without documentation of pregnancy/early miscarriage, home pregnancy test, urine pregnancy test and quant beta hCG all negative.  Will attempt control with the Nuvaring and complete the investigation with a pelvic ultrasound at follow-up. - US Transvaginal Non-OB; Future  4. Class 3 severe obesity due to excess calories without serious comorbidity with body mass index (BMI) of 40.0 to 44.9 in adult [facility]) Recommend a lower calorie/carb diet such as [employer].  May try intermittent fasting.  Aerobic physical activities recommended 5 times a week with light weightlifting every 2 days.  Other orders - etonogestrel-ethinyl estradiol (NUVARING) 0.12-0.015 MG/24HR vaginal ring; Place 1 each vaginally every 28 (twenty-eight) days. Insert vaginally and leave in place for 3 consecutive weeks, then remove for 1 week.  Deshon, it was a pleasure seeing you today!

## 2019-09-10 DIAGNOSIS — F321 Major depressive disorder, single episode, moderate: Secondary | ICD-10-CM | POA: Diagnosis not present

## 2019-09-10 DIAGNOSIS — F411 Generalized anxiety disorder: Secondary | ICD-10-CM | POA: Diagnosis not present

## 2019-09-11 DIAGNOSIS — Z20828 Contact with and (suspected) exposure to other viral communicable diseases: Secondary | ICD-10-CM | POA: Diagnosis not present

## 2019-09-12 ENCOUNTER — Telehealth (INDEPENDENT_AMBULATORY_CARE_PROVIDER_SITE_OTHER): Payer: BC Managed Care – PPO | Admitting: Family Medicine

## 2019-09-12 ENCOUNTER — Other Ambulatory Visit: Payer: Self-pay

## 2019-09-12 DIAGNOSIS — Z20822 Contact with and (suspected) exposure to covid-19: Secondary | ICD-10-CM | POA: Diagnosis not present

## 2019-09-12 DIAGNOSIS — F3289 Other specified depressive episodes: Secondary | ICD-10-CM | POA: Diagnosis not present

## 2019-09-12 DIAGNOSIS — E559 Vitamin D deficiency, unspecified: Secondary | ICD-10-CM

## 2019-09-12 DIAGNOSIS — D508 Other iron deficiency anemias: Secondary | ICD-10-CM

## 2019-09-12 DIAGNOSIS — Z6841 Body Mass Index (BMI) 40.0 and over, adult: Secondary | ICD-10-CM

## 2019-09-12 NOTE — Progress Notes (Signed)
TeleHealth Visit:  Due to the [name] pandemic, this visit was completed with telemedicine (audio/video) technology to reduce patient and provider exposure as well as to preserve personal protective equipment.   Kathleen Joyce has verbally consented to this TeleHealth visit. The patient is located at home, the provider is located at the [facility] office. The participants in this visit include the listed provider and patient. The visit was conducted today via [email or web address].  Chief Complaint: OBESITY Kathleen Joyce is here to discuss her progress with her obesity treatment plan along with follow-up of her obesity related diagnoses. Kathleen Joyce is on the Category 2 Plan and states she is following her eating plan approximately 80% of the time. Kathleen Joyce states she is exercising for 0 minutes 0 times per week.  Today's visit was #: 5 Starting weight: 284 lbs Starting date: 118/18/2020  Interim History: Kathleen Joyce has been feeling better since her GYN visit.  She likely has a fibroid.  She has an upcoming ultrasound.  She will start Nuvaring after her next menses.  She spent time with her fiance in [place].  Felt sick yesterday and had a COVID test.  Subjective:   1. Vitamin D deficiency Kathleen Joyce's Vitamin D level was 14.9 on 07/11/2019. She is currently taking vit D. She denies nausea, vomiting or muscle weakness.  2. Other iron deficiency anemia Patient with recent menometrorrhagia.  She has been to see her GYN.  CBC Latest Ref Rng & Units 08/28/2019 07/11/2019 05/12/2018  WBC 3.4 - 10.8 x10E3/uL 7.3 7.3 6.3  Hemoglobin 11.1 - 15.9 g/dL 12.4 12.4 12.1  Hematocrit 34.0 - 46.6 % 36.7 39.0 37.3  Platelets 150 - 450 x10E3/uL 253 224 215.0   Lab Results  Component Value Date   IRON 71 08/28/2019   TIBC 314 08/28/2019   FERRITIN 27 08/28/2019   Lab Results  Component Value Date   [id number] 08/28/2019   3. Other depression, with emotional eating Kathleen Joyce is struggling with emotional eating and using food  for comfort to the extent that it is negatively impacting her health. She has been working on behavior modification techniques to help reduce her emotional eating and has been unsuccessful. She shows no sign of suicidal or homicidal ideations.  She is taking Wellbutrin and states it is helping.  4.  COVID exposure Kathleen Joyce has recently traveled to [place] and was not feeling well yesterday.  Assessment/Plan:   1. Vitamin D deficiency Low Vitamin D level contributes to fatigue and are associated with obesity, breast, and colon cancer. She agrees to continue to take prescription Vitamin D @50 ,000 IU every week and will follow-up for routine testing of Vitamin D, at least 2-3 times per year to avoid over-replacement.  - Vitamin D, Ergocalciferol, (DRISDOL) 1.25 MG (50000 UNIT) CAPS capsule; Take 1 capsule (50,000 Units total) by mouth every 7 (seven) days.  Dispense: 4 capsule; Refill: 0  2. Other iron deficiency anemia Will monitor polyphagia with initiation of Nuvaring.  Counseling . Iron is essential for our bodies to make red blood cells.  Reasons that someone may be deficient include: an iron-deficient diet (more likely in those following vegan or vegetarian diets), women with heavy menses, patients with GI disorders or poor absorption, patients that have had bariatric surgery, frequent blood donors, patients with cancer, and patients with heart disease.   Kathleen Joyce foods include dark leafy greens, red and white meats, eggs, seafood, and beans.   . Certain foods and drinks prevent your body from absorbing iron  properly. Avoid eating these foods in the same meal as iron-rich foods or with iron supplements. These foods include: coffee, black tea, and red wine; milk, dairy products, and foods that are high in calcium; beans and soybeans; whole grains.  . Constipation can be a side effect of iron supplementation. Increased water and fiber intake are helpful. Water goal: > 2 liters/day. Fiber goal: >  25 grams/day.  - ferrous sulfate (FERROUSUL) 325 (65 FE) MG tablet; Take 1 tablet (325 mg total) by mouth daily with breakfast.  Dispense: 30 tablet; Refill: 0  3. Other depression, with emotional eating Behavior modification techniques were discussed today to help Shirel deal with her emotional/non-hunger eating behaviors.  Orders and follow up as documented in patient record.   - buPROPion (WELLBUTRIN XL) 150 MG 24 hr tablet; Take 1 tablet (150 mg total) by mouth daily.  Dispense: 30 tablet; Refill: 0  4. COVID exposure We will continue to monitor. Orders and follow up as documented in patient record.  Counseling  [name] is a respiratory infection that is caused by a virus. It can cause serious infections, such as pneumonia, acute respiratory distress syndrome, acute respiratory failure, or sepsis.  You are more likely to develop a serious illness if you are 36 years of age or older, have a weak immune system, live in a nursing home, have chronic disease, or have obesity.  Get vaccinated as soon as they are available to you.  For our most current information, please visit [email or web address].  Wash your hands often with soap and water for 20 seconds. If soap and water are not available, use alcohol-based hand sanitizer.  Wear a face mask. Make sure your mask covers your nose and mouth.  Maintain at least 6 feet distance from others when in public.  Get help right away if  You have trouble breathing, chest pain, confusion, or other concerning symptoms.  5. Class 3 severe obesity with serious comorbidity and body mass index (BMI) of 40.0 to 44.9 in adult, unspecified obesity type (Kathleen Joyce) Kathleen Joyce is currently in the action stage of change. As such, her goal is to continue with weight loss efforts. She has agreed to the Category 2 Plan.   Exercise goals: For substantial health benefits, adults should do at least 150 minutes (2 hours and 30 minutes) a week of moderate-intensity,  or 75 minutes (1 hour and 15 minutes) a week of vigorous-intensity aerobic physical activity, or an equivalent combination of moderate- and vigorous-intensity aerobic activity. Aerobic activity should be performed in episodes of at least 10 minutes, and preferably, it should be spread throughout the week. Adults should also include muscle-strengthening activities that involve all major muscle groups on 2 or more days a week.  Behavioral modification strategies: increasing water intake, dealing with family or coworker sabotage and planning for success.  Kathleen Joyce has agreed to follow-up with our clinic in 2 weeks. She was informed of the importance of frequent follow-up visits to maximize her success with intensive lifestyle modifications for her multiple health conditions.  Objective:   VITALS: Per patient if applicable, see vitals. GENERAL: Alert and in no acute distress. CARDIOPULMONARY: No increased WOB. Speaking in clear sentences.  PSYCH: Pleasant and cooperative. Speech normal rate and rhythm. Affect is appropriate. Insight and judgement are appropriate. Attention is focused, linear, and appropriate.  NEURO: Oriented as arrived to appointment on time with no prompting.   Lab Results  Component Value Date   CREATININE 0.73 08/28/2019   BUN 8 08/28/2019  NA 138 08/28/2019   K 4.3 08/28/2019   CL 105 08/28/2019   CO2 19 (L) 08/28/2019   Lab Results  Component Value Date   ALT 16 08/28/2019   AST 12 08/28/2019   ALKPHOS 53 08/28/2019   BILITOT <0.2 08/28/2019   Lab Results  Component Value Date   HGBA1C 5.4 07/11/2019   Lab Results  Component Value Date   INSULIN 24.1 07/11/2019   Lab Results  Component Value Date   TSH 3.100 08/28/2019   Lab Results  Component Value Date   CHOL 159 07/11/2019   HDL 46 07/11/2019   LDLCALC 98 07/11/2019   TRIG 78 07/11/2019   CHOLHDL 4 05/12/2018   Lab Results  Component Value Date   WBC 7.3 08/28/2019   HGB 12.4 08/28/2019   HCT  36.7 08/28/2019   MCV 83 08/28/2019   PLT 253 08/28/2019   Lab Results  Component Value Date   IRON 71 08/28/2019   TIBC 314 08/28/2019   FERRITIN 27 08/28/2019    Attestation Statements:   Reviewed by clinician on day of visit: allergies, medications, problem list, medical history, surgical history, family history, social history, and previous encounter notes.  I, Water quality scientist, CMA, am acting as Location manager for [employer], DO.  I have reviewed the above documentation for accuracy and completeness, and I agree with the above. Briscoe Deutscher, DO

## 2019-09-16 MED ORDER — FERROUS SULFATE 325 (65 FE) MG PO TABS: 325.0000 mg | ORAL_TABLET | Freq: Every day | ORAL | 0 refills | Status: DC

## 2019-09-16 MED ORDER — BUPROPION HCL ER (XL) 150 MG PO TB24: 150.0000 mg | ORAL_TABLET | Freq: Every day | ORAL | 0 refills | Status: DC

## 2019-09-16 MED ORDER — VITAMIN D (ERGOCALCIFEROL) 1.25 MG (50000 UNIT) PO CAPS: 50000.0000 [IU] | ORAL_CAPSULE | ORAL | 0 refills | Status: DC

## 2019-09-17 DIAGNOSIS — F321 Major depressive disorder, single episode, moderate: Secondary | ICD-10-CM | POA: Diagnosis not present

## 2019-09-17 DIAGNOSIS — F411 Generalized anxiety disorder: Secondary | ICD-10-CM | POA: Diagnosis not present

## 2019-09-24 DIAGNOSIS — F411 Generalized anxiety disorder: Secondary | ICD-10-CM | POA: Diagnosis not present

## 2019-09-24 DIAGNOSIS — F321 Major depressive disorder, single episode, moderate: Secondary | ICD-10-CM | POA: Diagnosis not present

## 2019-09-26 ENCOUNTER — Encounter (INDEPENDENT_AMBULATORY_CARE_PROVIDER_SITE_OTHER): Payer: Self-pay | Admitting: Family Medicine

## 2019-09-26 ENCOUNTER — Other Ambulatory Visit: Payer: Self-pay

## 2019-09-26 ENCOUNTER — Other Ambulatory Visit: Payer: Self-pay | Admitting: Family Medicine

## 2019-09-26 ENCOUNTER — Telehealth (INDEPENDENT_AMBULATORY_CARE_PROVIDER_SITE_OTHER): Payer: BC Managed Care – PPO | Admitting: Family Medicine

## 2019-09-26 DIAGNOSIS — F3289 Other specified depressive episodes: Secondary | ICD-10-CM

## 2019-09-26 DIAGNOSIS — D508 Other iron deficiency anemias: Secondary | ICD-10-CM

## 2019-09-26 DIAGNOSIS — E559 Vitamin D deficiency, unspecified: Secondary | ICD-10-CM

## 2019-09-26 DIAGNOSIS — Z6841 Body Mass Index (BMI) 40.0 and over, adult: Secondary | ICD-10-CM

## 2019-09-27 MED ORDER — VITAMIN D (ERGOCALCIFEROL) 1.25 MG (50000 UNIT) PO CAPS: 50000.0000 [IU] | ORAL_CAPSULE | ORAL | 0 refills | Status: DC

## 2019-09-27 NOTE — Progress Notes (Signed)
TeleHealth Visit:  Due to the [name] pandemic, this visit was completed with telemedicine (audio/video) technology to reduce patient and provider exposure as well as to preserve personal protective equipment.   Kathleen Joyce has verbally consented to this TeleHealth visit. The patient is located at home, the provider is located at the [facility] office. The participants in this visit include the listed provider and patient. The visit was conducted today via [email or web address].  Chief Complaint: OBESITY Kathleen Joyce is here to discuss her progress with her obesity treatment plan along with follow-up of her obesity related diagnoses. Kathleen Joyce is on the Category 2 Plan and states she is following her eating plan approximately 75% of the time. Kathleen Joyce states she is walking for 30 minutes 7 times a week and working out for 60 minutes 2 times per week.  Today's visit was #: 6 Starting weight: 284 lbs Starting date: 07/11/2019  Interim History: Kathleen Joyce has lots of fun and exciting things happening this month. She will be doing her first wedding dress shopping next week.  Subjective:   1. Vitamin D deficiency Beautiful's Vitamin D level was 14.9 on 07/11/2019. She is currently taking vit D. She denies nausea, vomiting or muscle weakness.  2. Other iron deficiency anemia Kathleen Joyce is not a vegetarian.  She does not have a history of weight loss surgery.   CBC Latest Ref Rng & Units 08/28/2019 07/11/2019 05/12/2018  WBC 3.4 - 10.8 x10E3/uL 7.3 7.3 6.3  Hemoglobin 11.1 - 15.9 g/dL 12.4 12.4 12.1  Hematocrit 34.0 - 46.6 % 36.7 39.0 37.3  Platelets 150 - 450 x10E3/uL 253 224 215.0   Lab Results  Component Value Date   IRON 71 08/28/2019   TIBC 314 08/28/2019   FERRITIN 27 08/28/2019   Lab Results  Component Value Date   [id number] 08/28/2019   3. Other depression, with emotional eating Kathleen Joyce is struggling with emotional eating and using food for comfort to the extent that it is negatively impacting her  health. She has been working on behavior modification techniques to help reduce her emotional eating and has been unsuccessful. She shows no sign of suicidal or homicidal ideations.  She is taking Wellbutrin 150 mg daily.  Assessment/Plan:   1. Vitamin D deficiency Low Vitamin D level contributes to fatigue and are associated with obesity, breast, and colon cancer. She agrees to continue to take prescription Vitamin D @50 ,000 IU every week and will follow-up for routine testing of Vitamin D, at least 2-3 times per year to avoid over-replacement.  - Vitamin D, Ergocalciferol, (DRISDOL) 1.25 MG (50000 UNIT) CAPS capsule; Take 1 capsule (50,000 Units total) by mouth every 7 (seven) days.  Dispense: 4 capsule; Refill: 0  2. Other iron deficiency anemia Orders and follow up as documented in patient record.  Kathleen Joyce should continue taking her iron supplement.  Counseling . Iron is essential for our bodies to make red blood cells.  Reasons that someone may be deficient include: an iron-deficient diet (more likely in those following vegan or vegetarian diets), women with heavy menses, patients with GI disorders or poor absorption, patients that have had bariatric surgery, frequent blood donors, patients with cancer, and patients with heart disease.   Kathleen Joyce, red and white meats, eggs, seafood, and beans.   . Certain Joyce and drinks prevent your body from absorbing iron properly. Avoid eating these Joyce in the same meal as iron-rich Joyce or with iron supplements. These Joyce include: coffee,  black tea, and red wine; milk, dairy products, and Joyce that are high in calcium; beans and soybeans; whole grains.  . Constipation can be a side effect of iron supplementation. Increased water and fiber intake are helpful. Water goal: > 2 liters/day. Fiber goal: > 25 grams/day.  3. Other depression, with emotional eating Behavior modification techniques were discussed today to  help Kathleen Joyce deal with her emotional/non-hunger eating behaviors.  Orders and follow up as documented in patient record.   4. Class 3 severe obesity with serious comorbidity and body mass index (BMI) of 40.0 to 44.9 in adult, unspecified obesity type (Kathleen Joyce) Kathleen Joyce is currently in the action stage of change. As such, her goal is to continue with weight loss efforts. She has agreed to the Category 2 Plan.   Exercise goals: For substantial health benefits, adults should do at least 150 minutes (2 hours and 30 minutes) a week of moderate-intensity, or 75 minutes (1 hour and 15 minutes) a week of vigorous-intensity aerobic physical activity, or an equivalent combination of moderate- and vigorous-intensity aerobic activity. Aerobic activity should be performed in episodes of at least 10 minutes, and preferably, it should be spread throughout the week. Adults should also include muscle-strengthening activities that involve all major muscle groups on 2 or more days a week.  Behavioral modification strategies: increasing lean protein intake and increasing water intake.  Kathleen Joyce has agreed to follow-up with our clinic in 2 weeks. She was informed of the importance of frequent follow-up visits to maximize her success with intensive lifestyle modifications for her multiple health conditions.  Objective:   VITALS: Per patient if applicable, see vitals. GENERAL: Alert and in no acute distress. CARDIOPULMONARY: No increased WOB. Speaking in clear sentences.  PSYCH: Pleasant and cooperative. Speech normal rate and rhythm. Affect is appropriate. Insight and judgement are appropriate. Attention is focused, linear, and appropriate.  NEURO: Oriented as arrived to appointment on time with no prompting.   Lab Results  Component Value Date   CREATININE 0.73 08/28/2019   BUN 8 08/28/2019   NA 138 08/28/2019   K 4.3 08/28/2019   CL 105 08/28/2019   CO2 19 (L) 08/28/2019   Lab Results  Component Value Date   ALT 16  08/28/2019   AST 12 08/28/2019   ALKPHOS 53 08/28/2019   BILITOT <0.2 08/28/2019   Lab Results  Component Value Date   HGBA1C 5.4 07/11/2019   Lab Results  Component Value Date   INSULIN 24.1 07/11/2019   Lab Results  Component Value Date   TSH 3.100 08/28/2019   Lab Results  Component Value Date   CHOL 159 07/11/2019   HDL 46 07/11/2019   LDLCALC 98 07/11/2019   TRIG 78 07/11/2019   CHOLHDL 4 05/12/2018   Lab Results  Component Value Date   WBC 7.3 08/28/2019   HGB 12.4 08/28/2019   HCT 36.7 08/28/2019   MCV 83 08/28/2019   PLT 253 08/28/2019   Lab Results  Component Value Date   IRON 71 08/28/2019   TIBC 314 08/28/2019   FERRITIN 27 08/28/2019   Attestation Statements:   Reviewed by clinician on day of visit: allergies, medications, problem list, medical history, surgical history, family history, social history, and previous encounter notes.  I, Water quality scientist, CMA, am acting as Location manager for [employer], DO.  I have reviewed the above documentation for accuracy and completeness, and I agree with the above. Briscoe Deutscher, DO

## 2019-10-01 DIAGNOSIS — F321 Major depressive disorder, single episode, moderate: Secondary | ICD-10-CM | POA: Diagnosis not present

## 2019-10-01 DIAGNOSIS — F411 Generalized anxiety disorder: Secondary | ICD-10-CM | POA: Diagnosis not present

## 2019-10-08 DIAGNOSIS — F411 Generalized anxiety disorder: Secondary | ICD-10-CM | POA: Diagnosis not present

## 2019-10-08 DIAGNOSIS — F321 Major depressive disorder, single episode, moderate: Secondary | ICD-10-CM | POA: Diagnosis not present

## 2019-10-09 ENCOUNTER — Encounter (INDEPENDENT_AMBULATORY_CARE_PROVIDER_SITE_OTHER): Payer: Self-pay | Admitting: Family Medicine

## 2019-10-09 NOTE — Telephone Encounter (Signed)
Please advise 

## 2019-10-10 ENCOUNTER — Other Ambulatory Visit: Payer: Self-pay

## 2019-10-11 ENCOUNTER — Other Ambulatory Visit: Payer: BC Managed Care – PPO

## 2019-10-11 ENCOUNTER — Ambulatory Visit: Payer: BC Managed Care – PPO | Admitting: Obstetrics & Gynecology

## 2019-10-17 ENCOUNTER — Ambulatory Visit: Payer: BC Managed Care – PPO | Admitting: Obstetrics & Gynecology

## 2019-10-17 ENCOUNTER — Other Ambulatory Visit: Payer: BC Managed Care – PPO

## 2019-10-17 DIAGNOSIS — M9902 Segmental and somatic dysfunction of thoracic region: Secondary | ICD-10-CM | POA: Diagnosis not present

## 2019-10-17 DIAGNOSIS — M9901 Segmental and somatic dysfunction of cervical region: Secondary | ICD-10-CM | POA: Diagnosis not present

## 2019-10-17 DIAGNOSIS — M5386 Other specified dorsopathies, lumbar region: Secondary | ICD-10-CM | POA: Diagnosis not present

## 2019-10-17 DIAGNOSIS — R519 Headache, unspecified: Secondary | ICD-10-CM | POA: Diagnosis not present

## 2019-10-18 DIAGNOSIS — M9901 Segmental and somatic dysfunction of cervical region: Secondary | ICD-10-CM | POA: Diagnosis not present

## 2019-10-18 DIAGNOSIS — R519 Headache, unspecified: Secondary | ICD-10-CM | POA: Diagnosis not present

## 2019-10-18 DIAGNOSIS — M5386 Other specified dorsopathies, lumbar region: Secondary | ICD-10-CM | POA: Diagnosis not present

## 2019-10-18 DIAGNOSIS — M9902 Segmental and somatic dysfunction of thoracic region: Secondary | ICD-10-CM | POA: Diagnosis not present

## 2019-10-22 ENCOUNTER — Other Ambulatory Visit: Payer: Self-pay

## 2019-10-22 DIAGNOSIS — F3289 Other specified depressive episodes: Secondary | ICD-10-CM

## 2019-10-22 MED ORDER — BUPROPION HCL ER (XL) 150 MG PO TB24: 150.0000 mg | ORAL_TABLET | Freq: Every day | ORAL | 1 refills | Status: DC

## 2019-10-23 DIAGNOSIS — F411 Generalized anxiety disorder: Secondary | ICD-10-CM | POA: Diagnosis not present

## 2019-10-23 DIAGNOSIS — F321 Major depressive disorder, single episode, moderate: Secondary | ICD-10-CM | POA: Diagnosis not present

## 2019-10-29 DIAGNOSIS — F321 Major depressive disorder, single episode, moderate: Secondary | ICD-10-CM | POA: Diagnosis not present

## 2019-10-29 DIAGNOSIS — F411 Generalized anxiety disorder: Secondary | ICD-10-CM | POA: Diagnosis not present

## 2019-10-31 DIAGNOSIS — Z20828 Contact with and (suspected) exposure to other viral communicable diseases: Secondary | ICD-10-CM | POA: Diagnosis not present

## 2019-11-01 ENCOUNTER — Other Ambulatory Visit: Payer: Self-pay | Admitting: Women's Health

## 2019-11-02 NOTE — Telephone Encounter (Signed)
Spoke with patient and informed her. Rx sent. 

## 2019-11-02 NOTE — Telephone Encounter (Signed)
Patient called and said LMP:10/19/19 and still bleeding today. History of heavy irregular menses.

## 2019-11-21 ENCOUNTER — Other Ambulatory Visit: Payer: Self-pay

## 2019-11-22 ENCOUNTER — Ambulatory Visit: Payer: Self-pay | Admitting: Obstetrics & Gynecology

## 2019-11-22 ENCOUNTER — Ambulatory Visit: Payer: BC Managed Care – PPO

## 2019-11-24 ENCOUNTER — Other Ambulatory Visit (INDEPENDENT_AMBULATORY_CARE_PROVIDER_SITE_OTHER): Payer: Self-pay | Admitting: Family Medicine

## 2019-11-24 DIAGNOSIS — D508 Other iron deficiency anemias: Secondary | ICD-10-CM

## 2019-11-27 DIAGNOSIS — F321 Major depressive disorder, single episode, moderate: Secondary | ICD-10-CM | POA: Diagnosis not present

## 2019-11-27 DIAGNOSIS — F411 Generalized anxiety disorder: Secondary | ICD-10-CM | POA: Diagnosis not present

## 2019-12-03 ENCOUNTER — Other Ambulatory Visit: Payer: Self-pay

## 2019-12-04 ENCOUNTER — Ambulatory Visit: Payer: Self-pay | Admitting: Family Medicine

## 2019-12-04 DIAGNOSIS — F411 Generalized anxiety disorder: Secondary | ICD-10-CM | POA: Diagnosis not present

## 2019-12-04 DIAGNOSIS — F321 Major depressive disorder, single episode, moderate: Secondary | ICD-10-CM | POA: Diagnosis not present

## 2019-12-11 DIAGNOSIS — F411 Generalized anxiety disorder: Secondary | ICD-10-CM | POA: Diagnosis not present

## 2019-12-11 DIAGNOSIS — F321 Major depressive disorder, single episode, moderate: Secondary | ICD-10-CM | POA: Diagnosis not present

## 2019-12-13 ENCOUNTER — Telehealth: Payer: Self-pay | Admitting: Family Medicine

## 2019-12-13 ENCOUNTER — Ambulatory Visit
Admission: EM | Admit: 2019-12-13 | Discharge: 2019-12-13 | Disposition: A | Discharge status: Home / Self Care | Payer: BC Managed Care – PPO | Attending: Emergency Medicine | Admitting: Emergency Medicine

## 2019-12-13 DIAGNOSIS — R0789 Other chest pain: Secondary | ICD-10-CM

## 2019-12-13 DIAGNOSIS — F439 Reaction to severe stress, unspecified: Secondary | ICD-10-CM | POA: Diagnosis not present

## 2019-12-13 NOTE — ED Triage Notes (Signed)
Pt c/o center to lt sided chest with pain going through lt shoulder x5 days, having 1-2 episodes a day lasting 2-17mins. States has soreness after chest pain events. Pt denies SOB or diaphoresis.

## 2019-12-13 NOTE — Telephone Encounter (Signed)
Pt called and informed that nurse triage advised that she needed to be seen today for her chest pain. Advised that we do not have anyone in office today that could see her, we recommend that she goes to ER or UC patient agreed to go.

## 2019-12-13 NOTE — ED Provider Notes (Signed)
[facility]    CSN: [id number] Arrival date & time: 12/13/19  1130      History   Chief Complaint Chief Complaint  Patient presents with  . Chest Pain    HPI Kathleen Joyce is a 34 y.o. female with history of iron deficiency anemia, obesity, hyperlipidemia presenting for 5-day course of central and left-sided chest pain with associated left shoulder pain.  Patient has 1-2 episodes a day that last 2 to 3 minutes at a time.  States that she will feel sore after these events.  No dizziness, shortness of breath, diaphoresis, nausea or vomiting during these episodes.  Patient denies palpitations, history of cardiovascular disease.  States her mother did have a heart attack in September: Still living.  No other family members with history of cardiovascular disease, MI, young death.  Patient reports compliance with her iron supplementation.  No abdominal pain, hematochezia, melena, weakness or fatigue.  Patient does admit to a lot of stress at home: Currently planning wedding, was laid off from her job in February 2021, and her fianc is starting his post doctorate program.  Patient denies SI/HI.  Feels supported and safe at home.  Does take Wellbutrin for anxiety, which she feels may be contributing.  No recent dose changes.   Past Medical History:  Diagnosis Date  . Anemia   . Depression 07/12/2019  . Left knee pain   . Other hyperlipidemia 07/12/2019  . Plantar fasciitis   . Right hip pain     Patient Active Problem List   Diagnosis Date Noted  . Class 3 severe obesity with serious comorbidity and body mass index (BMI) of 40.0 to 44.9 in adult [facility]) 07/12/2019  . Depression 07/12/2019  . Other hyperlipidemia 07/12/2019  . Morbid obesity with BMI of 40.0-44.9, adult ([place]) 06/12/2019    History reviewed. No pertinent surgical history.  OB History    Gravida  0   Para  0   Term  0   Preterm  0   AB  0   Living  0     SAB  0   TAB  0   Ectopic  0   Multiple  0   Live Births  0            Home Medications    Prior to Admission medications   Medication Sig Start Date End Date Taking? Authorizing Provider  buPROPion (WELLBUTRIN XL) 150 MG 24 hr tablet Take 1 tablet (150 mg total) by mouth daily. 10/22/19   Martinique, Betty G, MD  etonogestrel-ethinyl estradiol (NUVARING) 0.12-0.015 MG/24HR vaginal ring Place 1 each vaginally every 28 (twenty-eight) days. Insert vaginally and leave in place for 3 consecutive weeks, then remove for 1 week. 09/05/19   Princess Bruins, MD  ferrous sulfate (FERROUSUL) 325 (65 FE) MG tablet Take 1 tablet (325 mg total) by mouth daily with breakfast. 09/16/19   Briscoe Deutscher, DO    Family History Family History  Problem Relation Age of Onset  . Lupus Mother   . Kidney disease Mother   . Depression Mother   . Diverticulitis Father   . Depression Father   . Diabetes Maternal Aunt   . Breast cancer Maternal Aunt 67  . Cancer Paternal Grandfather        colon    Social History Social History   Tobacco Use  . Smoking status: Former Research scientist (life sciences)  . Smokeless tobacco: Never Used  Substance Use Topics  . Alcohol use: Yes  Comment: rare  . Drug use: Not Currently     Allergies   Banana   Review of Systems As per HPI   Physical Exam Triage Vital Signs ED Triage Vitals  Enc Vitals Group     BP      Pulse      Resp      Temp      Temp src      SpO2      Weight      Height      Head Circumference      Peak Flow      Pain Score      Pain Loc      Pain Edu?      Excl. in [place]?    No data found.  Updated Vital Signs BP 124/80 (BP Location: Left Arm)   Pulse 80   Temp 98.1 F (36.7 C) (Oral)   Resp 16   LMP 11/24/2019   SpO2 96%   Visual Acuity Right Eye Distance:   Left Eye Distance:   Bilateral Distance:    Right Eye Near:   Left Eye Near:    Bilateral Near:     Physical Exam Constitutional:      General: She is not in acute distress.    Appearance: She is obese.  She is not ill-appearing.  HENT:     Head: Normocephalic and atraumatic.     Mouth/Throat:     Mouth: Mucous membranes are moist.     Pharynx: Oropharynx is clear.  Eyes:     General: No scleral icterus.    Conjunctiva/sclera: Conjunctivae normal.     Pupils: Pupils are equal, round, and reactive to light.  Neck:     Vascular: No JVD.     Trachea: No tracheal deviation.  Cardiovascular:     Rate and Rhythm: Normal rate and regular rhythm.     Heart sounds: Normal heart sounds.  Pulmonary:     Effort: Pulmonary effort is normal.     Breath sounds: Normal breath sounds.  Chest:     Chest wall: No mass, deformity, tenderness or crepitus.  Skin:    Coloration: Skin is not jaundiced or pale.  Neurological:     Mental Status: She is alert and oriented to person, place, and time.      UC Treatments / Results  Labs (all labs ordered are listed, but only abnormal results are displayed) Labs Reviewed - No data to display  EKG   Radiology No results found.  Procedures Procedures (including critical care time)  Medications Ordered in UC Medications - No data to display  Initial Impression / Assessment and Plan / UC Course  I have reviewed the triage vital signs and the nursing notes.  Pertinent labs & imaging results that were available during my care of the patient were reviewed by me and considered in my medical decision making (see chart for details).     Patient afebrile, nontoxic in office today.  Hemodynamically stable.  EKG done in office, reviewed by me and compared to previous from 07/11/2019: NSR with sinus arrhythmia.  Ventricular rate 62 bpm.  No QTC prolongation, ST elevation or depression.  Waveforms stable-nonacute EKG.  Reviewed findings with patient who verbalized understanding.  Low concern for acute/life-threatening process.  Stress could be contributing.  Low concern for acute anemia given patient's reassuring appearance and lack of systemic symptoms or  change in medications and no evidence of active bleeding.  Patient to  follow-up with primary care for further evaluation if symptoms persist.  Return precautions discussed, patient verbalized understanding and is agreeable to plan. Final Clinical Impressions(s) / UC Diagnoses   Final diagnoses:  Stress at home  Other chest pain     Discharge Instructions     Your EKG today was reassuring. Keep symptom log, discussed with PCP as outlined below. Go to ER for persistent chest pain, lightheadedness/dizziness, difficulty breathing, sweating, nausea or vomiting.    ED Prescriptions    None     PDMP not reviewed this encounter.   Hall-Potvin, [place], [place] 12/13/19 1302

## 2019-12-13 NOTE — Discharge Instructions (Signed)
Your EKG today was reassuring. Keep symptom log, discussed with PCP as outlined below. Go to ER for persistent chest pain, lightheadedness/dizziness, difficulty breathing, sweating, nausea or vomiting.

## 2019-12-13 NOTE — Telephone Encounter (Signed)
The patient called to make an appointment next week for Dr. Martinique. She is have chest pains for the past 5-6 days now.  I transferred the patient to nurse triage.

## 2019-12-14 NOTE — Telephone Encounter (Signed)
Spoke with patient and she stated that she is feeling better. She went to ER on yesterday and they told her to follow-up in a week with PCP. Patient scheduled for 12/19/2019.

## 2019-12-14 NOTE — Telephone Encounter (Signed)
I already answered another message today. We can arrange a virtual visit now. Thanks, BJ

## 2019-12-14 NOTE — Telephone Encounter (Signed)
Message Routed to PCP for review and approval. 

## 2019-12-14 NOTE — Telephone Encounter (Signed)
Message Routed to PCP for review. 

## 2019-12-14 NOTE — Telephone Encounter (Signed)
Spoke with patient and she stated that she went to ER on yesterday and that she was advised to f/u with PCP in a week. Patient scheduled for 12/19/2019.

## 2019-12-14 NOTE — Telephone Encounter (Signed)
It is ok to arrange appt. I pain gets worse , or develops SOB/palpitations/diaphoresis, she needs go to the ER. Thanks, BJ

## 2019-12-18 ENCOUNTER — Other Ambulatory Visit: Payer: Self-pay

## 2019-12-18 DIAGNOSIS — F411 Generalized anxiety disorder: Secondary | ICD-10-CM | POA: Diagnosis not present

## 2019-12-18 DIAGNOSIS — F321 Major depressive disorder, single episode, moderate: Secondary | ICD-10-CM | POA: Diagnosis not present

## 2019-12-19 ENCOUNTER — Encounter: Payer: Self-pay | Admitting: Family Medicine

## 2019-12-19 ENCOUNTER — Ambulatory Visit (INDEPENDENT_AMBULATORY_CARE_PROVIDER_SITE_OTHER): Payer: BC Managed Care – PPO | Admitting: Family Medicine

## 2019-12-19 VITALS — BP 118/74 | HR 84 | Temp 97.9°F | Resp 12 | Ht 70.0 in | Wt 285.2 lb

## 2019-12-19 DIAGNOSIS — R079 Chest pain, unspecified: Secondary | ICD-10-CM

## 2019-12-19 DIAGNOSIS — F3289 Other specified depressive episodes: Secondary | ICD-10-CM

## 2019-12-19 DIAGNOSIS — F419 Anxiety disorder, unspecified: Secondary | ICD-10-CM

## 2019-12-19 DIAGNOSIS — E559 Vitamin D deficiency, unspecified: Secondary | ICD-10-CM

## 2019-12-19 DIAGNOSIS — D508 Other iron deficiency anemias: Secondary | ICD-10-CM | POA: Diagnosis not present

## 2019-12-19 DIAGNOSIS — M940 Chondrocostal junction syndrome [Tietze]: Secondary | ICD-10-CM

## 2019-12-19 DIAGNOSIS — D509 Iron deficiency anemia, unspecified: Secondary | ICD-10-CM | POA: Insufficient documentation

## 2019-12-19 DIAGNOSIS — R002 Palpitations: Secondary | ICD-10-CM

## 2019-12-19 HISTORY — DX: Anxiety disorder, unspecified: F41.9

## 2019-12-19 MED ORDER — CITALOPRAM HYDROBROMIDE 20 MG PO TABS: 20.0000 mg | ORAL_TABLET | Freq: Every day | ORAL | 3 refills | Status: DC

## 2019-12-19 NOTE — Assessment & Plan Note (Signed)
Currently she is not on vitamin D supplementation. Further recommendation will be given according to lab results.

## 2019-12-19 NOTE — Assessment & Plan Note (Addendum)
Today Celexa 20 mg was added, some side effects discussed. Continue weekly psychotherapy. Follow-up in 6 weeks, before if needed.

## 2019-12-19 NOTE — Progress Notes (Signed)
Chief Complaint  Patient presents with  . ER follow-up    ER follow-up for chest pain    HPI:  [name] is a 34 y.o. female with history of anxiety depression who is here today to follow on recent ER visit.   She was evaluated in the ER on 12/13/2019 because of chest pain. Diagnosed with stress at home. She started with left-sided upper chest pain on 12/11/2019.She has had some chest tightness in the past when having acute anxiety but "not like this." According to patient, EKG was normal.  She is still having achy leg pain, intermittent, it usually last about 15 minutes, it is not related with exertion, sometimes happens when she is in bed. She has not noted acid reflux or heartburn.  She also has intermittent episodes of palpitations and diaphoresis, not at the same time that the chest pain.Exacerbated by anxiety.  Sometimes she also has left upper back pain. She has not noted cough, wheezing, or dyspnea.  Pain is exacerbated by certain movements. She has not identified alleviating factors.  Currently she is on Wellbutrin XR 150 mg daily to treat depression. She feels like anxiety is getting worse, there has been "a lot of changes" since her last visit. She is moving to [place], getting married, she lost her job in 09/2019, and her mother is dealing with some health issues. She is thinking about going back to school.  She is doing counseling once per week.  Depression screen [facility] 2/9 12/19/2019 07/11/2019 05/14/2018  Decreased Interest 2 2 0  Down, Depressed, Hopeless 1 3 0  PHQ - 2 Score 3 5 0  Altered sleeping 3 3 -  Tired, decreased energy 2 3 -  Change in appetite 1 3 -  Feeling bad or failure about yourself  3 2 -  Trouble concentrating 3 3 -  Moving slowly or fidgety/restless 0 2 -  Suicidal thoughts 0 3 -  PHQ-9 Score 15 24 -  Difficult doing work/chores Somewhat difficult Somewhat difficult -    She would like to have labs done today.  Lab  Results  Component Value Date   CREATININE 0.73 08/28/2019   BUN 8 08/28/2019   NA 138 08/28/2019   K 4.3 08/28/2019   CL 105 08/28/2019   CO2 19 (L) 08/28/2019   Lab Results  Component Value Date   WBC 7.3 08/28/2019   HGB 12.4 08/28/2019   HCT 36.7 08/28/2019   MCV 83 08/28/2019   PLT 253 08/28/2019   Vit D deficiency: She completed 8 weeks treatment. Last 25 OH vit D low at  14.9 on 07/11/19.   Review of Systems  Constitutional: Positive for fatigue. Negative for activity change, appetite change and fever.  HENT: Negative for mouth sores, nosebleeds and sore throat.   Eyes: Negative for redness and visual disturbance.  Cardiovascular: Negative for leg swelling.  Gastrointestinal: Negative for abdominal pain, nausea and vomiting.       Negative for changes in bowel habits.  Genitourinary: Negative for decreased urine volume and hematuria.  Musculoskeletal: Negative for gait problem and myalgias.  Skin: Negative for pallor and rash.  Neurological: Negative for syncope, weakness and headaches.  Rest see pertinent positives and negatives per HPI.   Current Outpatient Medications on File Prior to Visit  Medication Sig Dispense Refill  . buPROPion (WELLBUTRIN XL) 150 MG 24 hr tablet Take 1 tablet (150 mg total) by mouth daily. 90 tablet 1  . ferrous sulfate (  FERROUSUL) 325 (65 FE) MG tablet Take 1 tablet (325 mg total) by mouth daily with breakfast. 30 tablet 0   No current facility-administered medications on file prior to visit.   Past Medical History:  Diagnosis Date  . Anemia   . Anxiety disorder 12/19/2019  . Depression 07/12/2019  . Left knee pain   . Other hyperlipidemia 07/12/2019  . Plantar fasciitis   . Right hip pain    Allergies  Allergen Reactions  . Banana     Social History   Socioeconomic History  . Marital status: Single    Spouse name: Not on file  . Number of children: 0  . Years of education: Not on file  . Highest education level: Not  on file  Occupational History  . Not on file  Tobacco Use  . Smoking status: Former Research scientist (life sciences)  . Smokeless tobacco: Never Used  Substance and Sexual Activity  . Alcohol use: Yes    Comment: rare  . Drug use: Not Currently  . Sexual activity: Yes    Partners: Male    Comment: 1st intercourse- 73, partners- 45, current partner- 26 yrs  Other Topics Concern  . Not on file  Social History Narrative  . Not on file   Social Determinants of Health   Financial Resource Strain:   . Difficulty of Paying Living Expenses:   Food Insecurity:   . Worried About Charity fundraiser in the Last Year:   . Arboriculturist in the Last Year:   Transportation Needs:   . Film/video editor (Medical):   Marland Kitchen Lack of Transportation (Non-Medical):   Physical Activity:   . Days of Exercise per Week:   . Minutes of Exercise per Session:   Stress:   . Feeling of Stress :   Social Connections:   . Frequency of Communication with Friends and Family:   . Frequency of Social Gatherings with Friends and Family:   . Attends Religious Services:   . Active Member of Clubs or Organizations:   . Attends Archivist Meetings:   Marland Kitchen Marital Status:     Vitals:   12/19/19 1429  BP: 118/74  Pulse: 84  Resp: 12  Temp: 97.9 F (36.6 C)  SpO2: 99%   Wt Readings from Last 3 Encounters:  12/19/19 285 lb 4 oz (129.4 kg)  09/05/19 284 lb (128.8 kg)  08/28/19 282 lb (127.9 kg)   Body mass index is 40.93 kg/m.  Physical Exam  Nursing note and vitals reviewed. Constitutional: She is oriented to person, place, and time. She appears well-developed. No distress.  HENT:  Head: Normocephalic and atraumatic.  Mouth/Throat: Oropharynx is clear and moist and mucous membranes are normal.  Eyes: Pupils are equal, round, and reactive to light. Conjunctivae are normal.  Cardiovascular: Normal rate and regular rhythm.  No murmur heard. Pulses:      Dorsalis pedis pulses are 2+ on the right side and 2+ on the  left side.  Respiratory: Effort normal and breath sounds normal. No respiratory distress. She exhibits tenderness.  GI: Soft. She exhibits no mass. There is no hepatomegaly. There is no abdominal tenderness.  Musculoskeletal:        General: No edema.       Arms:     Comments: Tenderness upon palpation of left costochondral joints.  Lymphadenopathy:    She has no cervical adenopathy.  Neurological: She is alert and oriented to person, place, and time. She has normal strength.  No cranial nerve deficit. Gait normal.  Skin: Skin is warm. No rash noted. No erythema.  Psychiatric: Her mood appears anxious.  Well groomed, good eye contact.    ASSESSMENT AND PLAN:  Ms.Eddy was seen today for er follow-up.  Diagnoses and all orders for this visit:  Orders Placed This Encounter  Procedures  . Basic metabolic panel  . CBC  . VITAMIN D 25 Hydroxy (Vit-D Deficiency, Fractures)   Lab Results  Component Value Date   CREATININE 0.96 12/19/2019   BUN 6 12/19/2019   NA 136 12/19/2019   K 4.3 12/19/2019   CL 100 12/19/2019   CO2 28 12/19/2019   Lab Results  Component Value Date   WBC 9.3 12/19/2019   HGB 13.4 12/19/2019   HCT 41.0 12/19/2019   MCV 87.2 12/19/2019   PLT 214.0 12/19/2019    Chest pain, unspecified type We discussed possible causes. History is not very suggestive of cardiac etiology, explained the likelihood of this is low but never 0. It seems to be musculoskeletal.  She is reporting normal EKG in the ER, I cannot see tracing. Instructed about warning signs.  Costochondritis Educated about diagnosis, prognosis, and treatment options.  Palpitation We discussed possible etiologies. Hx doe snot suggest a serious process. Instructed about warning signs. If problem continues we could consider cardio evaluation.  Iron deficiency anemia Last CBC was normal range. Continue iron supplementation. Further recommendation will be given according to CBC  results.  Vitamin D deficiency Currently she is not on vitamin D supplementation. Further recommendation will be given according to lab results.  Anxiety disorder Today Celexa 20 mg was added, some side effects discussed. Continue weekly psychotherapy. Follow-up in 6 weeks, before if needed.  Depression Still symptoms tic but improved when compared with prior PHQ. Continue Wellbutrin XR 150 mg daily. F/U in 6 weeks.   Return in about 6 weeks (around 01/30/2020) for anxiety,CP.  Jazmyn Offner G. Martinique, MD  [facility]. [place] office.   A few things to remember from today's visit:   Start Celexa 20 mg daily. No changes in Wellbutrin.   Costochondritis Costochondritis is swelling and irritation (inflammation) of the tissue (cartilage) that connects your ribs to your breastbone (sternum). This causes pain in the front of your chest. Usually, the pain:  Starts gradually.  Is in more than one rib. This condition usually goes away on its own over time. Follow these instructions at home:  Do not do anything that makes your pain worse.  If directed, put ice on the painful area: ? Put ice in a plastic bag. ? Place a towel between your skin and the bag. ? Leave the ice on for 20 minutes, 2-3 times a day.  If directed, put heat on the affected area as often as told by your doctor. Use the heat source that your doctor tells you to use, such as a moist heat pack or a heating pad. ? Place a towel between your skin and the heat source. ? Leave the heat on for 20-30 minutes. ? Take off the heat if your skin turns bright red. This is very important if you cannot feel pain, heat, or cold. You may have a greater risk of getting burned.  Take over-the-counter and prescription medicines only as told by your doctor.  Return to your normal activities as told by your doctor. Ask your doctor what activities are safe for you.  Keep all follow-up visits as told by your doctor. This is  important. Contact a doctor if:  You have chills or a fever.  Your pain does not go away or it gets worse.  You have a cough that does not go away. Get help right away if:  You are short of breath. This information is not intended to replace advice given to you by your health care provider. Make sure you discuss any questions you have with your health care provider. Document Revised: 08/24/2017 Document Reviewed: 12/03/2015 Elsevier Patient Education  [place].    Please be sure medication list is accurate. If a new problem present, please set up appointment sooner than planned today.

## 2019-12-19 NOTE — Assessment & Plan Note (Addendum)
Last CBC was normal range. Continue iron supplementation. Further recommendation will be given according to CBC results.

## 2019-12-19 NOTE — Patient Instructions (Signed)
A few things to remember from today's visit:   Start Celexa 20 mg daily. No changes in Wellbutrin.   Costochondritis Costochondritis is swelling and irritation (inflammation) of the tissue (cartilage) that connects your ribs to your breastbone (sternum). This causes pain in the front of your chest. Usually, the pain:  Starts gradually.  Is in more than one rib. This condition usually goes away on its own over time. Follow these instructions at home:  Do not do anything that makes your pain worse.  If directed, put ice on the painful area: ? Put ice in a plastic bag. ? Place a towel between your skin and the bag. ? Leave the ice on for 20 minutes, 2-3 times a day.  If directed, put heat on the affected area as often as told by your doctor. Use the heat source that your doctor tells you to use, such as a moist heat pack or a heating pad. ? Place a towel between your skin and the heat source. ? Leave the heat on for 20-30 minutes. ? Take off the heat if your skin turns bright red. This is very important if you cannot feel pain, heat, or cold. You may have a greater risk of getting burned.  Take over-the-counter and prescription medicines only as told by your doctor.  Return to your normal activities as told by your doctor. Ask your doctor what activities are safe for you.  Keep all follow-up visits as told by your doctor. This is important. Contact a doctor if:  You have chills or a fever.  Your pain does not go away or it gets worse.  You have a cough that does not go away. Get help right away if:  You are short of breath. This information is not intended to replace advice given to you by your health care provider. Make sure you discuss any questions you have with your health care provider. Document Revised: 08/24/2017 Document Reviewed: 12/03/2015 Elsevier Patient Education  [place].    Please be sure medication list is accurate. If a new problem present,  please set up appointment sooner than planned today.

## 2019-12-19 NOTE — Assessment & Plan Note (Signed)
Still symptoms tic but improved when compared with prior PHQ. Continue Wellbutrin XR 150 mg daily. F/U in 6 weeks.

## 2019-12-20 ENCOUNTER — Encounter: Payer: Self-pay | Admitting: Obstetrics & Gynecology

## 2019-12-20 ENCOUNTER — Encounter: Payer: Self-pay | Admitting: Family Medicine

## 2019-12-20 ENCOUNTER — Other Ambulatory Visit: Payer: Self-pay

## 2019-12-20 ENCOUNTER — Ambulatory Visit: Payer: BC Managed Care – PPO | Admitting: Obstetrics & Gynecology

## 2019-12-20 ENCOUNTER — Ambulatory Visit (INDEPENDENT_AMBULATORY_CARE_PROVIDER_SITE_OTHER): Payer: BC Managed Care – PPO

## 2019-12-20 VITALS — BP 124/80

## 2019-12-20 DIAGNOSIS — Z30011 Encounter for initial prescription of contraceptive pills: Secondary | ICD-10-CM | POA: Diagnosis not present

## 2019-12-20 DIAGNOSIS — E6609 Other obesity due to excess calories: Secondary | ICD-10-CM

## 2019-12-20 DIAGNOSIS — N921 Excessive and frequent menstruation with irregular cycle: Secondary | ICD-10-CM

## 2019-12-20 DIAGNOSIS — Z6839 Body mass index (BMI) 39.0-39.9, adult: Secondary | ICD-10-CM

## 2019-12-20 LAB — BASIC METABOLIC PANEL
BUN: 6 mg/dL (ref 6–23)
CO2: 28 mEq/L (ref 19–32)
Calcium: 9 mg/dL (ref 8.4–10.5)
Chloride: 100 mEq/L (ref 96–112)
Creatinine, Ser: 0.96 mg/dL (ref 0.40–1.20)
GFR: 67.18 mL/min (ref >60.00)
Glucose, Bld: 76 mg/dL (ref 70–99)
Potassium: 4.3 mEq/L (ref 3.5–5.1)
Sodium: 136 mEq/L (ref 135–145)

## 2019-12-20 LAB — CBC
HCT: 41 % (ref 36.0–46.0)
Hemoglobin: 13.4 g/dL (ref 12.0–15.0)
MCHC: 32.7 g/dL (ref 30.0–36.0)
MCV: 87.2 fl (ref 78.0–100.0)
Platelets: 214 10*3/uL (ref 150.0–400.0)
RBC: 4.7 Mil/uL (ref 3.87–5.11)
RDW: 14.4 % (ref 11.5–15.5)
WBC: 9.3 10*3/uL (ref 4.0–10.5)

## 2019-12-20 LAB — VITAMIN D 25 HYDROXY (VIT D DEFICIENCY, FRACTURES): VITD: 27.42 ng/mL — ABNORMAL LOW (ref 30.00–100.00)

## 2019-12-20 MED ORDER — NORETHIN ACE-ETH ESTRAD-FE 1-20 MG-MCG(24) PO TABS: 1.0000 | ORAL_TABLET | Freq: Every day | ORAL | 4 refills | Status: DC

## 2019-12-20 NOTE — Progress Notes (Signed)
    Kathleen Joyce August 15, 1986 [id number]        34 y.o.  G0  RP: Menometrorrhagia  HPI: At visit 08/2019 we noted: Menses heavy and irregular.  Patient was started on Nuvaring at that time, but the ring caused pain, so patient stopped using it right away.  Would like to try another contraceptive.  Active weight loss currently.  OB History  Gravida Para Term Preterm AB Living  0 0 0 0 0 0  SAB TAB Ectopic Multiple Live Births  0 0 0 0 0    Past medical history,surgical history, problem list, medications, allergies, family history and social history were all reviewed and documented in the EPIC chart.   Directed ROS with pertinent positives and negatives documented in the history of present illness/assessment and plan.  Exam:  Vitals:   12/20/19 1007  BP: 124/80   General appearance:  Normal  Pelvic US today: T/V images.  Anteverted uterus irregular in contour with 2 subserosal fibroids measured at 2.9 x 2.6 cm anterior to the left and 1.7 x 1.4 cm posteriorly.  The overall uterine size is measured at 7.66 x 5.62 x 5.16 cm.  The endometrial lining is symmetrical measured at 7.67 mm with no mass or thickening seen.  Both ovaries are normal in size with normal follicular pattern time.  A dominant follicle is present on the right side measured at 1.6 x 1.1 cm.  No adnexal mass seen.  No free fluid in the posterior cul-de-sac.   Assessment/Plan:  34 y.o. G0P0000   1. Menometrorrhagia Menometrorrhagia with pelvic ultrasound today.  Ultrasound findings reviewed with patient.  Patient reassured that the overall size of the uterus is normal and that the 2 small fibroids present are subserosal which is the location on likely to cause any fertility problem or bleeding issues.  The endometrial lining is normal.  Both ovaries are normal with a dominant follicle present on the right ovary, which means that patient is ovulatory.  Decision to start on a low-dose birth control for contraception and  cycle control.  2. Encounter for initial prescription of contraceptive pills Decision to start on the generic of Loestrin 24 FE 1/20.  No contraindication.  Usage reviewed and prescription sent to pharmacy.  3. Class 2 obesity due to excess calories without serious comorbidity with body mass index (BMI) of 39.0 to 39.9 in adult Will continue weight loss with a low calorie/carb diet and a fitness program.  Other orders - Norethindrone Acetate-Ethinyl Estrad-FE (LOESTRIN 24 FE) 1-20 MG-MCG(24) tablet; Take 1 tablet by mouth daily.  Princess Bruins MD, 10:28 AM 12/20/2019

## 2019-12-21 ENCOUNTER — Encounter: Payer: Self-pay | Admitting: Obstetrics & Gynecology

## 2019-12-21 NOTE — Patient Instructions (Signed)
1. Menometrorrhagia Menometrorrhagia with pelvic ultrasound today.  Ultrasound findings reviewed with patient.  Patient reassured that the overall size of the uterus is normal and that the 2 small fibroids present are subserosal which is the location on likely to cause any fertility problem or bleeding issues.  The endometrial lining is normal.  Both ovaries are normal with a dominant follicle present on the right ovary, which means that patient is ovulatory.  Decision to start on a low-dose birth control for contraception and cycle control.  2. Encounter for initial prescription of contraceptive pills Decision to start on the generic of Loestrin 24 FE 1/20.  No contraindication.  Usage reviewed and prescription sent to pharmacy.  3. Class 2 obesity due to excess calories without serious comorbidity with body mass index (BMI) of 39.0 to 39.9 in adult Will continue weight loss with a low calorie/carb diet and a fitness program.  Other orders - Norethindrone Acetate-Ethinyl Estrad-FE (LOESTRIN 24 FE) 1-20 MG-MCG(24) tablet; Take 1 tablet by mouth daily.  Ezrie, it was a pleasure seeing you today!

## 2019-12-25 DIAGNOSIS — F321 Major depressive disorder, single episode, moderate: Secondary | ICD-10-CM | POA: Diagnosis not present

## 2019-12-25 DIAGNOSIS — F411 Generalized anxiety disorder: Secondary | ICD-10-CM | POA: Diagnosis not present

## 2020-01-01 DIAGNOSIS — F321 Major depressive disorder, single episode, moderate: Secondary | ICD-10-CM | POA: Diagnosis not present

## 2020-01-01 DIAGNOSIS — F411 Generalized anxiety disorder: Secondary | ICD-10-CM | POA: Diagnosis not present

## 2020-01-08 DIAGNOSIS — F321 Major depressive disorder, single episode, moderate: Secondary | ICD-10-CM | POA: Diagnosis not present

## 2020-01-08 DIAGNOSIS — F411 Generalized anxiety disorder: Secondary | ICD-10-CM | POA: Diagnosis not present

## 2020-01-15 DIAGNOSIS — F321 Major depressive disorder, single episode, moderate: Secondary | ICD-10-CM | POA: Diagnosis not present

## 2020-01-15 DIAGNOSIS — F411 Generalized anxiety disorder: Secondary | ICD-10-CM | POA: Diagnosis not present

## 2020-01-22 DIAGNOSIS — F321 Major depressive disorder, single episode, moderate: Secondary | ICD-10-CM | POA: Diagnosis not present

## 2020-01-22 DIAGNOSIS — F411 Generalized anxiety disorder: Secondary | ICD-10-CM | POA: Diagnosis not present

## 2020-01-29 DIAGNOSIS — F321 Major depressive disorder, single episode, moderate: Secondary | ICD-10-CM | POA: Diagnosis not present

## 2020-01-29 DIAGNOSIS — F411 Generalized anxiety disorder: Secondary | ICD-10-CM | POA: Diagnosis not present

## 2020-01-31 ENCOUNTER — Other Ambulatory Visit: Payer: Self-pay

## 2020-02-01 ENCOUNTER — Ambulatory Visit: Payer: BC Managed Care – PPO | Admitting: Family Medicine

## 2020-02-06 DIAGNOSIS — F411 Generalized anxiety disorder: Secondary | ICD-10-CM | POA: Diagnosis not present

## 2020-02-06 DIAGNOSIS — F321 Major depressive disorder, single episode, moderate: Secondary | ICD-10-CM | POA: Diagnosis not present

## 2020-02-12 DIAGNOSIS — F321 Major depressive disorder, single episode, moderate: Secondary | ICD-10-CM | POA: Diagnosis not present

## 2020-02-12 DIAGNOSIS — F411 Generalized anxiety disorder: Secondary | ICD-10-CM | POA: Diagnosis not present

## 2020-02-15 ENCOUNTER — Ambulatory Visit: Payer: BC Managed Care – PPO | Admitting: Family Medicine

## 2020-02-19 DIAGNOSIS — F321 Major depressive disorder, single episode, moderate: Secondary | ICD-10-CM | POA: Diagnosis not present

## 2020-02-19 DIAGNOSIS — F411 Generalized anxiety disorder: Secondary | ICD-10-CM | POA: Diagnosis not present

## 2020-02-22 ENCOUNTER — Ambulatory Visit: Payer: BC Managed Care – PPO | Admitting: Family Medicine

## 2020-03-04 DIAGNOSIS — F321 Major depressive disorder, single episode, moderate: Secondary | ICD-10-CM | POA: Diagnosis not present

## 2020-03-04 DIAGNOSIS — F411 Generalized anxiety disorder: Secondary | ICD-10-CM | POA: Diagnosis not present

## 2020-03-11 DIAGNOSIS — F321 Major depressive disorder, single episode, moderate: Secondary | ICD-10-CM | POA: Diagnosis not present

## 2020-03-11 DIAGNOSIS — F411 Generalized anxiety disorder: Secondary | ICD-10-CM | POA: Diagnosis not present

## 2020-03-24 ENCOUNTER — Ambulatory Visit: Payer: BC Managed Care – PPO | Admitting: Family Medicine

## 2020-03-24 DIAGNOSIS — Z0289 Encounter for other administrative examinations: Secondary | ICD-10-CM

## 2020-03-25 ENCOUNTER — Ambulatory Visit
Admission: EM | Admit: 2020-03-25 | Discharge: 2020-03-25 | Disposition: A | Discharge status: Home / Self Care | Payer: BC Managed Care – PPO | Attending: Physician Assistant | Admitting: Physician Assistant

## 2020-03-25 DIAGNOSIS — R0789 Other chest pain: Secondary | ICD-10-CM

## 2020-03-25 MED ORDER — TIZANIDINE HCL 2 MG PO TABS: 2.0000 mg | ORAL_TABLET | Freq: Three times a day (TID) | ORAL | 0 refills | Status: DC | PRN

## 2020-03-25 MED ORDER — MELOXICAM 7.5 MG PO TABS: 7.5000 mg | ORAL_TABLET | Freq: Every day | ORAL | 0 refills | Status: DC

## 2020-03-25 NOTE — ED Triage Notes (Signed)
Pt c/o lt sided chest pain x2 days. States pain worsen in the past 45 mins with pain radiating to lt upper back/shoulder. States just drove 4.5hrs back home. Denies diaphroesis or nausea. States some SOB at times. States had 4 episodes in 88mins. States has taken 6 baby asa in the past 24hrs. No distress noted.

## 2020-03-25 NOTE — ED Provider Notes (Signed)
[facility]    CSN: [id number] Arrival date & time: 03/25/20  1206      History   Chief Complaint Chief Complaint  Patient presents with  . Chest Pain    HPI Kathleen Joyce is a 34 y.o. female.   34 year old female comes in for 2-day history of left-sided chest pain.  States for the past 45 minutes, this has been worse.  Pain is to the left chest, dull constant pain with occasional stabbing pain.  This can radiate to the back/shoulder.  Holding up her breasts help with the pain.  No obvious aggravating factor.  Denies associated diaphoresis, nausea.  States due to the pain, will "catch her breath", but not particularly short of breath.  Denies any exertional chest pain, exertional fatigue, dyspnea on exertion.  Denies one-sided leg swelling/pain, OCP use, personal or family history of DVT.  Just finished a 4.5-hour drive back home.  Mother with history of MI at age 42.  Denies family history of early MIs.  Denies personal history of heart disease.  Denies URI symptoms, fever, abdominal pain.  Patient had visit for chest pain 11/2019, states symptoms are different.     Past Medical History:  Diagnosis Date  . Anemia   . Anxiety disorder 12/19/2019  . Depression 07/12/2019  . Left knee pain   . Other hyperlipidemia 07/12/2019  . Plantar fasciitis   . Right hip pain     Patient Active Problem List   Diagnosis Date Noted  . Iron deficiency anemia 12/19/2019  . Vitamin D deficiency 12/19/2019  . Anxiety disorder 12/19/2019  . Class 3 severe obesity with serious comorbidity and body mass index (BMI) of 40.0 to 44.9 in adult [facility]) 07/12/2019  . Depression 07/12/2019  . Other hyperlipidemia 07/12/2019  . Morbid obesity with BMI of 40.0-44.9, adult ([place]) 06/12/2019    History reviewed. No pertinent surgical history.  OB History    Gravida  0   Para  0   Term  0   Preterm  0   AB  0   Living  0     SAB  0   TAB  0   Ectopic  0   Multiple  0    Live Births  0            Home Medications    Prior to Admission medications   Medication Sig Start Date End Date Taking? Authorizing Provider  meloxicam (MOBIC) 7.5 MG tablet Take 1 tablet (7.5 mg total) by mouth daily. 03/25/20   Tasia Catchings, Zlaty Alexa V, PA-C  tiZANidine (ZANAFLEX) 2 MG tablet Take 1 tablet (2 mg total) by mouth every 8 (eight) hours as needed for muscle spasms. 03/25/20   Ok Edwards, PA-C    Family History Family History  Problem Relation Age of Onset  . Lupus Mother   . Kidney disease Mother   . Depression Mother   . Diverticulitis Father   . Depression Father   . Diabetes Maternal Aunt   . Breast cancer Maternal Aunt 67  . Cancer Paternal Grandfather        colon    Social History Social History   Tobacco Use  . Smoking status: Former Research scientist (life sciences)  . Smokeless tobacco: Never Used  Vaping Use  . Vaping Use: Never used  Substance Use Topics  . Alcohol use: Not Currently    Comment: rare  . Drug use: Not Currently     Allergies   Banana  Review of Systems Review of Systems  Reason unable to perform ROS: See HPI as above.     Physical Exam Triage Vital Signs ED Triage Vitals [03/25/20 1247]  Enc Vitals Group     BP 111/76     Pulse Rate 67     Resp 18     Temp 97.9 F (36.6 C)     Temp Source Oral     SpO2 98 %     Weight      Height      Head Circumference      Peak Flow      Pain Score 0     Pain Loc      Pain Edu?      Excl. in [place]?    No data found.  Updated Vital Signs BP 111/76 (BP Location: Left Arm)   Pulse 67   Temp 97.9 F (36.6 C) (Oral)   Resp 18   LMP 03/22/2020   SpO2 98%   Physical Exam Constitutional:      General: She is not in acute distress.    Appearance: Normal appearance. She is well-developed. She is not toxic-appearing or diaphoretic.  HENT:     Head: Normocephalic and atraumatic.  Eyes:     Conjunctiva/sclera: Conjunctivae normal.     Pupils: Pupils are equal, round, and reactive to light.    Cardiovascular:     Rate and Rhythm: Normal rate and regular rhythm.     Heart sounds: No murmur heard.  No friction rub. No gallop.   Pulmonary:     Effort: Pulmonary effort is normal. No respiratory distress.     Comments: LCTAB Chest:     Comments: No tenderness to palpation of the chest, but states the pressure relieves symptoms Musculoskeletal:     Cervical back: Normal range of motion and neck supple.     Right lower leg: No edema.     Left lower leg: No edema.     Comments: No tenderness to palpation of the back, states pressure relieves symptoms.   Skin:    General: Skin is warm and dry.  Neurological:     Mental Status: She is alert and oriented to person, place, and time.      UC Treatments / Results  Labs (all labs ordered are listed, but only abnormal results are displayed) Labs Reviewed - No data to display  EKG   Radiology No results found.  Procedures Procedures (including critical care time)  Medications Ordered in UC Medications - No data to display  Initial Impression / Assessment and Plan / UC Course  I have reviewed the triage vital signs and the nursing notes.  Pertinent labs & imaging results that were available during my care of the patient were reviewed by me and considered in my medical decision making (see chart for details).     Patient afebrile, nontoxic, no acute distress.  Stable vitals.  Heart regular rate and rhythm without murmurs, gallops, rubs.  Lungs clear to auscultation bilaterally without adventitious lung sounds. EKG NSR, 61bpm, no acute ST changes, unchanged from prior. History and exam with low suspicion for ACS. Wells' score of 0, low suspicion for PE at this time. Will treat symptomatically for muscular pain. NSAIDs, muscle relaxant. To follow up with PCP if symptoms not improving.  Return precautions given.  Final Clinical Impressions(s) / UC Diagnoses   Final diagnoses:  Atypical chest pain    ED Prescriptions     Medication Sig  Dispense Auth. Provider   meloxicam (MOBIC) 7.5 MG tablet Take 1 tablet (7.5 mg total) by mouth daily. 10 tablet Ezra Marquess V, PA-C   tiZANidine (ZANAFLEX) 2 MG tablet Take 1 tablet (2 mg total) by mouth every 8 (eight) hours as needed for muscle spasms. 15 tablet Ok Edwards, PA-C     PDMP not reviewed this encounter.   Ok Edwards, PA-C 03/25/20 1401

## 2020-03-25 NOTE — Discharge Instructions (Addendum)
No alarming signs on exam. Your EKG was normal. Vitals normal. Not worried for your heart. As discussed, cannot rule out blood clot in lungs, but lower suspicion right now. If sudden worsening of chest pain, trouble breathing, go to the ED for further evaluation.  Otherwise we will treat for muscle pain. Start Mobic. Do not take ibuprofen (motrin/advil)/ naproxen (aleve) while on mobic. Robaxin as needed, this can make you drowsy, so do not take if you are going to drive, operate heavy machinery, or make important decisions. Follow up with PCP if symptoms not improving, or reoccurs.

## 2020-08-23 NOTE — L&D Delivery Note (Addendum)
OB/GYN Faculty Practice Delivery Note  Kathleen Joyce is a 35 y.o. G1P0000 s/p SVD at [redacted]w[redacted]d. She was admitted for IOL for cHTN.   ROM: 3h 42m with clear fluid GBS Status: Negative Maximum Maternal Temperature: 98.8  Delivery Date/Time: 07/17/2021 at 1627 Delivery: Present in room while epidural placed. Category 2 FHT tracing  and cervix noted to be complete in +2 station. Patient pushed 2 times and head delivered, ROA. No nuchal cord present. Shoulder and body delivered in usual fashion. Infant with spontaneous cry, placed on mother's abdomen, dried and stimulated. Cord clamped x 2 after 5-minute delay, and cut by FOB. Cord blood drawn. Placenta delivered spontaneously with gentle cord traction. Fundus firm with massage and Pitocin. Labia, perineum, vagina, and cervix inspected inspected with 1st degree perineal and bilateral periurethral lacerations that were repaired.  Placenta: 3VC; Intact Complications: None Lacerations: 1st degree perineal laceration repaired with 3-0 vicryl. Bilateral periurethral lacerations repaired with 3-0 vicryl. EBL: 150 mL Analgesia: Epidural  Infant: Viable  APGARs 9 & 9  2890g  Orvis Brill, D.O. [place], PGY-1 07/17/2021, 5:07 PM     Attestation of CNM Supervision of Resident: Evaluation and management procedures were performed by the [facility] Medicine Resident under my supervision. I was gloved, present, and immediately available for direct supervision, assistance and direction throughout this encounter.  I also confirm that I have verified the information documented in the resident's note, and that I have also personally reperformed the pertinent components of the physical exam and all of the medical decision making activities.  I have also made any necessary editorial changes.  Renee Harder, CNM 07/17/2021 6:16 PM

## 2020-11-17 ENCOUNTER — Ambulatory Visit: Payer: BC Managed Care – PPO | Admitting: Nurse Practitioner

## 2020-12-03 ENCOUNTER — Ambulatory Visit: Payer: Self-pay | Admitting: Nurse Practitioner

## 2020-12-13 ENCOUNTER — Observation Stay (HOSPITAL_COMMUNITY)
Admission: AD | Admit: 2020-12-13 | Discharge: 2020-12-14 | Disposition: A | Discharge status: Home / Self Care | Payer: Managed Care, Other (non HMO) | Attending: Obstetrics and Gynecology | Admitting: Obstetrics and Gynecology

## 2020-12-13 ENCOUNTER — Encounter (HOSPITAL_COMMUNITY): Payer: Self-pay | Admitting: Emergency Medicine

## 2020-12-13 ENCOUNTER — Inpatient Hospital Stay (HOSPITAL_COMMUNITY): Payer: Managed Care, Other (non HMO)

## 2020-12-13 ENCOUNTER — Other Ambulatory Visit: Payer: Self-pay

## 2020-12-13 DIAGNOSIS — Z87891 Personal history of nicotine dependence: Secondary | ICD-10-CM | POA: Insufficient documentation

## 2020-12-13 DIAGNOSIS — O209 Hemorrhage in early pregnancy, unspecified: Secondary | ICD-10-CM

## 2020-12-13 DIAGNOSIS — O200 Threatened abortion: Secondary | ICD-10-CM | POA: Diagnosis present

## 2020-12-13 DIAGNOSIS — Z3A08 8 weeks gestation of pregnancy: Secondary | ICD-10-CM | POA: Insufficient documentation

## 2020-12-13 DIAGNOSIS — Z20822 Contact with and (suspected) exposure to covid-19: Secondary | ICD-10-CM | POA: Insufficient documentation

## 2020-12-13 DIAGNOSIS — O208 Other hemorrhage in early pregnancy: Principal | ICD-10-CM | POA: Insufficient documentation

## 2020-12-13 DIAGNOSIS — Z79899 Other long term (current) drug therapy: Secondary | ICD-10-CM | POA: Diagnosis not present

## 2020-12-13 LAB — CBC WITH DIFFERENTIAL/PLATELET
Abs Immature Granulocytes: 0.02 10*3/uL (ref 0.00–0.07)
Basophils Absolute: 0 10*3/uL (ref 0.0–0.1)
Basophils Relative: 0 %
Eosinophils Absolute: 0 10*3/uL (ref 0.0–0.5)
Eosinophils Relative: 0 %
HCT: 41.1 % (ref 36.0–46.0)
Hemoglobin: 13.3 g/dL (ref 12.0–15.0)
Immature Granulocytes: 0 %
Lymphocytes Relative: 26 %
Lymphs Abs: 2.5 10*3/uL (ref 0.7–4.0)
MCH: 28 pg (ref 26.0–34.0)
MCHC: 32.4 g/dL (ref 30.0–36.0)
MCV: 86.5 fL (ref 80.0–100.0)
Monocytes Absolute: 0.5 10*3/uL (ref 0.1–1.0)
Monocytes Relative: 6 %
Neutro Abs: 6.5 10*3/uL (ref 1.7–7.7)
Neutrophils Relative %: 68 %
Platelets: 220 10*3/uL (ref 150–400)
RBC: 4.75 MIL/uL (ref 3.87–5.11)
RDW: 13.4 % (ref 11.5–15.5)
WBC: 9.6 10*3/uL (ref 4.0–10.5)
nRBC: 0 % (ref 0.0–0.2)

## 2020-12-13 LAB — BASIC METABOLIC PANEL
Anion gap: 9 (ref 5–15)
BUN: 6 mg/dL (ref 6–20)
CO2: 23 mmol/L (ref 22–32)
Calcium: 9.2 mg/dL (ref 8.9–10.3)
Chloride: 103 mmol/L (ref 98–111)
Creatinine, Ser: 0.71 mg/dL (ref 0.44–1.00)
GFR, Estimated: >60 mL/min (ref >60)
Glucose, Bld: 83 mg/dL (ref 70–99)
Potassium: 3.7 mmol/L (ref 3.5–5.1)
Sodium: 135 mmol/L (ref 135–145)

## 2020-12-13 LAB — TYPE AND SCREEN
ABO/RH(D): A POS
Antibody Screen: NEGATIVE

## 2020-12-13 LAB — CBC
HCT: 35.2 % — ABNORMAL LOW (ref 36.0–46.0)
Hemoglobin: 11.6 g/dL — ABNORMAL LOW (ref 12.0–15.0)
MCH: 28.3 pg (ref 26.0–34.0)
MCHC: 33 g/dL (ref 30.0–36.0)
MCV: 85.9 fL (ref 80.0–100.0)
Platelets: 191 10*3/uL (ref 150–400)
RBC: 4.1 MIL/uL (ref 3.87–5.11)
RDW: 13.6 % (ref 11.5–15.5)
WBC: 10.5 10*3/uL (ref 4.0–10.5)
nRBC: 0 % (ref 0.0–0.2)

## 2020-12-13 LAB — I-STAT BETA HCG BLOOD, ED (MC, WL, AP ONLY): I-stat hCG, quantitative: >2000 m[IU]/mL — ABNORMAL HIGH (ref <5)

## 2020-12-13 LAB — RESP PANEL BY RT-PCR (FLU A&B, COVID) ARPGX2
Influenza A by PCR: NEGATIVE
Influenza B by PCR: NEGATIVE
SARS Coronavirus 2 by RT PCR: NEGATIVE

## 2020-12-13 LAB — ABO/RH: ABO/RH(D): A POS

## 2020-12-13 MED ORDER — CALCIUM CARBONATE ANTACID 500 MG PO CHEW: 2.0000 | CHEWABLE_TABLET | ORAL | Status: DC | PRN

## 2020-12-13 MED ORDER — DOCUSATE SODIUM 100 MG PO CAPS
100.0000 mg | ORAL_CAPSULE | Freq: Every day | ORAL | Status: DC
  Administered 2020-12-14: 100 mg via ORAL
  Filled 2020-12-13: qty 1

## 2020-12-13 MED ORDER — ACETAMINOPHEN 325 MG PO TABS: 650.0000 mg | ORAL_TABLET | ORAL | Status: DC | PRN

## 2020-12-13 MED ORDER — PROMETHAZINE HCL 25 MG PO TABS
12.5000 mg | ORAL_TABLET | ORAL | Status: DC | PRN
  Administered 2020-12-13 – 2020-12-14 (×2): 12.5 mg via ORAL
  Filled 2020-12-13 (×2): qty 1

## 2020-12-13 MED ORDER — ZOLPIDEM TARTRATE 5 MG PO TABS: 5.0000 mg | ORAL_TABLET | Freq: Every evening | ORAL | Status: DC | PRN

## 2020-12-13 MED ORDER — LACTATED RINGERS IV BOLUS
1000.0000 mL | Freq: Once | INTRAVENOUS | Status: AC
  Administered 2020-12-13: 1000 mL via INTRAVENOUS

## 2020-12-13 MED ORDER — LACTATED RINGERS IV SOLN: INTRAVENOUS | Status: DC

## 2020-12-13 MED ORDER — HYDROMORPHONE HCL 1 MG/ML IJ SOLN: 1.0000 mg | Freq: Once | INTRAMUSCULAR | Status: DC

## 2020-12-13 MED ORDER — PRENATAL MULTIVITAMIN CH
1.0000 | ORAL_TABLET | Freq: Every day | ORAL | Status: DC
  Administered 2020-12-14: 1 via ORAL
  Filled 2020-12-13: qty 1

## 2020-12-13 NOTE — H&P (Addendum)
History and physical:    Chief Complaint:  Vaginal Bleeding (9 weeks preg) and Abdominal Pain     HPI: Kathleen Joyce is a 35 y.o. G1P0000 at [redacted]w[redacted]d by LMP who presents to maternity admissions reporting vaginal bleeding and cramping. Denies fever, falls, or recent illness.   LMP 10/06/2020, feels overall confident in this date. Monthly periods prior to this, was not on contraception.   Lower abdominal constant cramping (severity 6/10, states "similar to my period cramps") and acute vaginal bleeding started around 1334 this afternoon. She felt wetness in her underwear, when went to the restroom noticed dark red blood clots/stringy bleeding in toilet. Intermittent vaginal spotting/brown discharge since about 5 weeks of pregnancy, no more than about 2 days in between each time with spotting. Small amount of brown discharge or occasional bright red on tissue when wiping during these times.    Reports at 5 weeks of pregnancy, had vaginal bleeding and went to ED in [place]. Ultrasound at that time did show intrauterine pregnancy and trending B-hcg went upwards (corrected miscommunication from ED provider note, it was "too early for abdominal U/S probe compared to transvaginal probe"). Repeat U/S was scheduled for this upcoming Tuesday in [place]. Does have a history of BV at that time and completed treatment about 1-2 weeks ago.  Currently lives in [place], originally from [place]. Flew up here prior to easter due to her mom being hospitalized, hopeful that her mom will discharged home tomorrow.    Past Medical History:  Diagnosis Date  . Anemia   . Anxiety disorder 12/19/2019  . Depression 07/12/2019  . Left knee pain   . Other hyperlipidemia 07/12/2019  . Plantar fasciitis   . Right hip pain    OB History  Gravida Para Term Preterm AB Living  1 0 0 0 0 0  SAB IAB Ectopic Multiple Live Births  0 0 0 0 0    # Outcome Date GA Lbr Len/2nd Weight Sex Delivery Anes PTL Lv  1 Current            Past  Surgical History:  Procedure Laterality Date  . NO PAST SURGERIES     Family History  Problem Relation Age of Onset  . Lupus Mother   . Kidney disease Mother   . Depression Mother   . Heart attack Mother   . Diverticulitis Father   . Depression Father   . Diabetes Maternal Aunt   . Breast cancer Maternal Aunt 67  . Cancer Paternal Grandfather        colon   Social History   Tobacco Use  . Smoking status: Former Research scientist (life sciences)  . Smokeless tobacco: Never Used  Vaping Use  . Vaping Use: Never used  Substance Use Topics  . Alcohol use: Not Currently    Comment: rare  . Drug use: Not Currently   Allergies  Allergen Reactions  . Banana    Medications Prior to Admission  Medication Sig Dispense Refill Last Dose  . Prenatal Vit-Fe Fumarate-FA (MULTIVITAMIN-PRENATAL) 27-0.8 MG TABS tablet Take 1 tablet by mouth daily at 12 noon.   12/13/2020 at 1000  . meloxicam (MOBIC) 7.5 MG tablet Take 1 tablet (7.5 mg total) by mouth daily. 10 tablet 0   . tiZANidine (ZANAFLEX) 2 MG tablet Take 1 tablet (2 mg total) by mouth every 8 (eight) hours as needed for muscle spasms. 15 tablet 0     I have reviewed patient's Past Medical Hx, Surgical Hx, Family Hx, Social Hx,  medications and allergies.   ROS:  Review of Systems  Gastrointestinal: Positive for nausea. Negative for abdominal pain, constipation, diarrhea and vomiting.  Genitourinary: Positive for pelvic pain and vaginal bleeding. Negative for dysuria.  Neurological: Negative for dizziness, syncope and light-headedness.    Physical Exam   Patient Vitals for the past 24 hrs:  BP Temp Temp src Pulse Resp SpO2 Height Weight  12/13/20 1639 (!) 147/58 98.1 F (36.7 C) Oral 81 18 98 % -- --  12/13/20 1630 135/82 98 F (36.7 C) Oral 77 20 100 % -- --  12/13/20 1625 -- -- -- -- -- -- 5\' 10"  (1.778 m) 135.4 kg  12/13/20 1423 121/83 98.5 F (36.9 C) Oral 75 18 100 % -- --    Constitutional: Well-developed, well-nourished female in no acute  distress.  Cardiovascular: normal rate & rhythm Respiratory: normal effort, lung sounds clear throughout GI: Abd soft, tender to bilateral lower abdomen without rebounding or guarding, gravid appropriate for gestational age. Pos BS x 4 MS: Extremities nontender, no edema b/l, normal ROM Neurologic: Alert and oriented x 4.  Pelvic:  Normal external female genitalia Vaginal vault with pink mucosa and good rugae.  Small amount of vaginal bleeding pooling in vault. Visualization of white tissue in cervical os, approximately 1 cm in size.  Chaperoned by RN.      Fetal Tracing: N/A    Labs: Results for orders placed or performed during the hospital encounter of 12/13/20 (from the past 24 hour(s))  I-Stat Beta hCG blood, ED ([facility], [facility], [facility] only)     Status: Abnormal   Collection Time: 12/13/20  2:34 PM  Result Value Ref Range   I-stat hCG, quantitative >2,000.0 (H) <5 mIU/mL   Comment 3            Imaging:  US OB Comp Less 14 Wks  Result Date: 12/13/2020 CLINICAL DATA:  Vaginal bleeding, no beta HCG reported at time of interpretation EXAM: OBSTETRIC <14 WK ULTRASOUND TECHNIQUE: Transabdominal ultrasound was performed for evaluation of the gestation as well as the maternal uterus and adnexal regions. COMPARISON:  December 10, 2019 FINDINGS: Intrauterine gestational sac: Single Yolk sac:  Visualized. Embryo:  Visualized. Cardiac Activity: Visualized. Heart Rate: 169 bpm CRL: 21.4 mm   8 w 5 d                  [place] EDC: July 20, 2021 Subchorionic hemorrhage:  Small volume subchorionic hemorrhage. Maternal uterus/adnexae: Unremarkable IMPRESSION: Single viable intrauterine pregnancy with small volume subchorionic hemorrhage. Electronically Signed   By: Dahlia Bailiff MD   On: 12/13/2020 18:11    MAU Course: Orders Placed This Encounter  Procedures  . Wet prep, genital  . US OB Comp Less 14 Wks  . CBC with Differential/Platelet  . Basic metabolic panel  . I-Stat Beta hCG blood, ED ([facility], [facility], [facility]  only)  . ABO/Rh   Meds ordered this encounter  Medications  . lactated ringers bolus 1,000 mL  . HYDROmorphone (DILAUDID) injection 1 mg    Assessment/Plan/MDM: 1. Vaginal bleeding in pregnancy, first trimester    35 year old female at [redacted]w[redacted]d by LMP presenting for acute onset vaginal bleeding and abdominal cramping.  Reported early U/S with evidence of intrauterine pregnancy, however do not have records.  Proceeded with evaluation for pregnancy of unknown location/viability, placed orders for CBC, BMP, ABO/Rh, urinalysis, wet prep, GC/CH, and U/S.   Performed sterile speculum exam with Len Blalock, CNM. Upon visualization of cervix, noted white tissue  at cervical os.  Tissue removed with forceps, followed by brisk vaginal bleeding. She remained hemodynamically stable with mild fatigue. IV subsequently placed with liter fluid bolus given.  U/S performed bedside showing single viable intrauterine pregnancy with small subchorionic hemorrhage, discordant with initial concern of POC. Dr. Elgie Congo informed and presented to bedside. Vaginal bleeding slowed with evidence of tissue still within cervical os of unclear etiology.  Recommended admission for further observation, labs pending.  Plan: Follow-up initial labs Repeat H&H in 4 hours Monitor for recurrent bleeding Observe for at least 24 hours pending stability      Darrelyn Hillock, DO Family Medicine PGY-3   Results for orders placed or performed during the hospital encounter of 12/13/20 (from the past 24 hour(s))  I-Stat Beta hCG blood, ED ([facility], [facility], [facility] only)     Status: Abnormal   Collection Time: 12/13/20  2:34 PM  Result Value Ref Range   I-stat hCG, quantitative >2,000.0 (H) <5 mIU/mL   Comment 3          CBC with Differential/Platelet     Status: None   Collection Time: 12/13/20  5:59 PM  Result Value Ref Range   WBC 9.6 4.0 - 10.5 K/uL   RBC 4.75 3.87 - 5.11 MIL/uL   Hemoglobin 13.3 12.0 - 15.0 g/dL   HCT 41.1 36.0 - 46.0 %   MCV  86.5 80.0 - 100.0 fL   MCH 28.0 26.0 - 34.0 pg   MCHC 32.4 30.0 - 36.0 g/dL   RDW 13.4 11.5 - 15.5 %   Platelets 220 150 - 400 K/uL   nRBC 0.0 0.0 - 0.2 %   Neutrophils Relative % 68 %   Neutro Abs 6.5 1.7 - 7.7 K/uL   Lymphocytes Relative 26 %   Lymphs Abs 2.5 0.7 - 4.0 K/uL   Monocytes Relative 6 %   Monocytes Absolute 0.5 0.1 - 1.0 K/uL   Eosinophils Relative 0 %   Eosinophils Absolute 0.0 0.0 - 0.5 K/uL   Basophils Relative 0 %   Basophils Absolute 0.0 0.0 - 0.1 K/uL   Immature Granulocytes 0 %   Abs Immature Granulocytes 0.02 0.00 - 0.07 K/uL  ABO/Rh     Status: None   Collection Time: 12/13/20  5:59 PM  Result Value Ref Range   ABO/RH(D) A POS    No rh immune globuloin      NOT A RH IMMUNE GLOBULIN CANDIDATE, PT RH POSITIVE Performed at [facility], [address], [place], [place] [address]     [place] Comp Less 14 Wks  Result Date: 12/13/2020 CLINICAL DATA:  Vaginal bleeding, no beta HCG reported at time of interpretation EXAM: OBSTETRIC <14 WK ULTRASOUND TECHNIQUE: Transabdominal ultrasound was performed for evaluation of the gestation as well as the maternal uterus and adnexal regions. COMPARISON:  December 10, 2019 FINDINGS: Intrauterine gestational sac: Single Yolk sac:  Visualized. Embryo:  Visualized. Cardiac Activity: Visualized. Heart Rate: 169 bpm CRL: 21.4 mm   8 w 5 d                  [place] EDC: July 20, 2021 Subchorionic hemorrhage:  Small volume subchorionic hemorrhage. Maternal uterus/adnexae: Unremarkable IMPRESSION: Single viable intrauterine pregnancy with small volume subchorionic hemorrhage. Electronically Signed   By: Dahlia Bailiff MD   On: 12/13/2020 18:11   CNM at bedside for speculum exam. Brisk bright red bleeding after attempted removal of tissue at cervical os. EBL approximately 264ml. Dr. Elgie Congo notified  of bleeding and exam. After ultrasound, Dr. Elgie Congo at bedside to repeat exam. Vaginal bleeding slowed but tissue still visible in the os. Dr. Elgie Congo  recommends admission for observation and repeat CBC in 4 hours. Plan of care extensively reviewed and patient verbalized understanding.   -Threatened miscarriage -Vaginal bleeding in pregnancy -[redacted] weeks gestation of pregnancy  -Admit to [facility] turned over to MD.   Wende Mott, CNM 12/13/20 7:07 PM    Addendum to note I was called to see the patient due to concern of increased bleeding with live IUP.  Sterile speculum exam performed by myself.  Significant amount of blood noted at vaginal opening.  Once the speculum was placed , a 6-7 cm clot was removed from the vagina.  At this point bleeding was at a slow ooze.  The cervix was open 0.5-1 cm and there was a piece of tissue in the cervical os.  It resembled POC, but this does not correspond to the U/S findings.  Apparently, when segment was removed previously, this was when bleeding increased dramatically.  Pt denied any current cramping.  The speculum was removed, and I discussed the findings with the patient and her spouse on the phone.  I told them with this amount of bleeding I was very concerned and considered this to be a threatened miscarriage.  However, the embryo was well placed in the uterus and was viable.  Subchorionic hemorrhage also was noted on the [place].  Since this is the patient's first pregnancy, she will be kept for observation.  She will be discharged in 24-48 hours if there is no further heavy bleeding.  She is aware that there are no significant interventions to salvage the pregnancy if bleeding increases due to the early gestational age.   Lynnda Shields, MD Faculty Attending [facility] for [facility] Attending Supervision of Advanced Practice Provider (PA/CNM/NP): Evaluation and management procedures were performed by the Advanced Practice Provider under my supervision and collaboration.  I have reviewed the Advanced Practice Provider's note and chart, and I agree with the management and plan.  I have also made any necessary editorial changes.   Griffin Basil, MD Attending [place], [facility] for [facility], [facility] 12/13/2020 8:01 PM

## 2020-12-13 NOTE — MAU Note (Signed)
Presents with c/o lower abdominal pain and VB that began today.  Reports VB is large amount and passing clots.  LMP 2nd week of February.

## 2020-12-13 NOTE — ED Triage Notes (Signed)
Pt states she is [redacted] weeks pregnant.  Reports vaginal bleeding and lower abd pain x 1 hour.

## 2020-12-13 NOTE — ED Provider Notes (Signed)
Emergency Medicine Provider OB Triage Evaluation Note  Kathleen Joyce is a 35 y.o. female, G1P0000, at Unknown gestation who presents to the emergency department with complaints of abdominal pain.  Patient states that she is [redacted] weeks pregnant.  States that she is from [place] and has had no care done in [facility].  She states that at approximately 3-5 weeks of pregnancy she did have some vaginal bleeding had ultrasound but was told that it was too early to see intra uterine pregnancy.  She states that today she again had an episode of bright red blood per vagina.  She states that she has had a clots and a "concerning amount of blood " She denies any chest pain shortness of breath lightheadedness or dizziness.  She states her abdominal pain is lower and seems to be achy and constant.  Review of  Systems  Positive: Abdominal pain vaginal bleeding Negative: Chest pain or shortness of breath  Physical Exam  BP 121/83 (BP Location: Left Arm)   Pulse 75   Temp 98.5 F (36.9 C) (Oral)   Resp 18   LMP 09/29/2020   SpO2 100%  General: Awake, no distress  HEENT: Atraumatic  Resp: Normal effort  Cardiac: Normal rate Abd: Nondistended, there is lower abdominal tenderness MSK: Moves all extremities without difficulty Neuro: Speech clear  Medical Decision Making  Pt evaluated for pregnancy concern and is stable for transfer to MAU. Pt is in agreement with plan for transfer.  2:59 PM Discussed with MAU APP, Kathleen Joyce, who accepts patient in transfer.  Patient is vital signs within normal limits.  Having vaginal bleeding and is [redacted] weeks pregnant hCG greater than 2000 consistent with pregnancy.  Will transfer to MAU at this time.  Patient agreeable with plan.  Clinical Impression  No diagnosis found.     Kathleen Joyce, [place] 12/13/20 1501    Kathleen Reichert, MD 12/13/20 1655

## 2020-12-13 NOTE — MAU Provider Note (Deleted)
Chief Complaint:  Vaginal Bleeding (9 weeks preg) and Abdominal Pain     HPI: Kathleen Joyce is a 35 y.o. G1P0000 at [redacted]w[redacted]d by LMP who presents to maternity admissions reporting vaginal bleeding and cramping. Denies fever, falls, or recent illness.   LMP 10/06/2020, feels overall confident in this date. Monthly periods prior to this, was not on contraception.   Lower abdominal constant cramping (severity 6/10, states "similar to my period cramps") and acute vaginal bleeding started around 1334 this afternoon. She felt wetness in her underwear, when went to the restroom noticed dark red blood clots/stringy bleeding in toilet. Intermittent vaginal spotting/brown discharge since about 5 weeks of pregnancy, no more than about 2 days in between each time with spotting. Small amount of brown discharge or occasional bright red on tissue when wiping during these times.    Reports at 5 weeks of pregnancy, had vaginal bleeding and went to ED in [place]. Ultrasound at that time did show intrauterine pregnancy and trending B-hcg went upwards (corrected miscommunication from ED provider note, it was "too early for abdominal U/S probe compared to transvaginal probe"). Repeat U/S was scheduled for this upcoming Tuesday in [place]. Does have a history of BV at that time and completed treatment about 1-2 weeks ago.  Currently in [place], originally from [place]. Flew up here prior to easter due to her mom being hospitalized, hopeful that she will discharged home tomorrow.    Past Medical History:  Diagnosis Date  . Anemia   . Anxiety disorder 12/19/2019  . Depression 07/12/2019  . Left knee pain   . Other hyperlipidemia 07/12/2019  . Plantar fasciitis   . Right hip pain    OB History  Gravida Para Term Preterm AB Living  1 0 0 0 0 0  SAB IAB Ectopic Multiple Live Births  0 0 0 0 0    # Outcome Date GA Lbr Len/2nd Weight Sex Delivery Anes PTL Lv  1 Current            Past Surgical History:  Procedure Laterality  Date  . NO PAST SURGERIES     Family History  Problem Relation Age of Onset  . Lupus Mother   . Kidney disease Mother   . Depression Mother   . Heart attack Mother   . Diverticulitis Father   . Depression Father   . Diabetes Maternal Aunt   . Breast cancer Maternal Aunt 67  . Cancer Paternal Grandfather        colon   Social History   Tobacco Use  . Smoking status: Former Research scientist (life sciences)  . Smokeless tobacco: Never Used  Vaping Use  . Vaping Use: Never used  Substance Use Topics  . Alcohol use: Not Currently    Comment: rare  . Drug use: Not Currently   Allergies  Allergen Reactions  . Banana    Medications Prior to Admission  Medication Sig Dispense Refill Last Dose  . Prenatal Vit-Fe Fumarate-FA (MULTIVITAMIN-PRENATAL) 27-0.8 MG TABS tablet Take 1 tablet by mouth daily at 12 noon.   12/13/2020 at 1000  . meloxicam (MOBIC) 7.5 MG tablet Take 1 tablet (7.5 mg total) by mouth daily. 10 tablet 0   . tiZANidine (ZANAFLEX) 2 MG tablet Take 1 tablet (2 mg total) by mouth every 8 (eight) hours as needed for muscle spasms. 15 tablet 0     I have reviewed patient's Past Medical Hx, Surgical Hx, Family Hx, Social Hx, medications and allergies.   ROS:  Review  of Systems  Gastrointestinal: Positive for nausea. Negative for abdominal pain, constipation, diarrhea and vomiting.  Genitourinary: Positive for pelvic pain and vaginal bleeding. Negative for dysuria.  Neurological: Negative for dizziness, syncope and light-headedness.    Physical Exam   Patient Vitals for the past 24 hrs:  BP Temp Temp src Pulse Resp SpO2 Height Weight  12/13/20 1900 126/67 98.3 F (36.8 C) Oral 92 17 99 % -- --  12/13/20 1639 (!) 147/58 98.1 F (36.7 C) Oral 81 18 98 % -- --  12/13/20 1630 135/82 98 F (36.7 C) Oral 77 20 100 % -- --  12/13/20 1625 -- -- -- -- -- -- 5\' 10"  (1.778 m) 135.4 kg  12/13/20 1423 121/83 98.5 F (36.9 C) Oral 75 18 100 % -- --    Constitutional: Well-developed,  well-nourished female in no acute distress.  Cardiovascular: normal rate & rhythm Respiratory: normal effort, lung sounds clear throughout GI: Abd soft, tender to bilateral lower abdomen without rebounding or guarding, gravid appropriate for gestational age. Pos BS x 4 MS: Extremities nontender, no edema b/l, normal ROM Neurologic: Alert and oriented x 4.  Pelvic:  Normal external female genitalia Vaginal vault with pink mucosa and good rugae.  Small amount of vaginal bleeding pooling in vault. Visualization of white tissue in cervical os, approximately 1 cm in size.  Chaperoned by RN.      Fetal Tracing: N/A    Labs: Results for orders placed or performed during the hospital encounter of 12/13/20 (from the past 24 hour(s))  I-Stat Beta hCG blood, ED ([facility], [facility], [facility] only)     Status: Abnormal   Collection Time: 12/13/20  2:34 PM  Result Value Ref Range   I-stat hCG, quantitative >2,000.0 (H) <5 mIU/mL   Comment 3          CBC with Differential/Platelet     Status: None   Collection Time: 12/13/20  5:59 PM  Result Value Ref Range   WBC 9.6 4.0 - 10.5 K/uL   RBC 4.75 3.87 - 5.11 MIL/uL   Hemoglobin 13.3 12.0 - 15.0 g/dL   HCT 41.1 36.0 - 46.0 %   MCV 86.5 80.0 - 100.0 fL   MCH 28.0 26.0 - 34.0 pg   MCHC 32.4 30.0 - 36.0 g/dL   RDW 13.4 11.5 - 15.5 %   Platelets 220 150 - 400 K/uL   nRBC 0.0 0.0 - 0.2 %   Neutrophils Relative % 68 %   Neutro Abs 6.5 1.7 - 7.7 K/uL   Lymphocytes Relative 26 %   Lymphs Abs 2.5 0.7 - 4.0 K/uL   Monocytes Relative 6 %   Monocytes Absolute 0.5 0.1 - 1.0 K/uL   Eosinophils Relative 0 %   Eosinophils Absolute 0.0 0.0 - 0.5 K/uL   Basophils Relative 0 %   Basophils Absolute 0.0 0.0 - 0.1 K/uL   Immature Granulocytes 0 %   Abs Immature Granulocytes 0.02 0.00 - 0.07 K/uL  Basic metabolic panel     Status: None   Collection Time: 12/13/20  5:59 PM  Result Value Ref Range   Sodium 135 135 - 145 mmol/L   Potassium 3.7 3.5 - 5.1 mmol/L   Chloride 103  98 - 111 mmol/L   CO2 23 22 - 32 mmol/L   Glucose, Bld 83 70 - 99 mg/dL   BUN 6 6 - 20 mg/dL   Creatinine, Ser 0.71 0.44 - 1.00 mg/dL   Calcium 9.2 8.9 - 10.3 mg/dL  GFR, Estimated >60 >60 mL/min   Anion gap 9 5 - 15  ABO/Rh     Status: None   Collection Time: 12/13/20  5:59 PM  Result Value Ref Range   ABO/RH(D) A POS    No rh immune globuloin      NOT A RH IMMUNE GLOBULIN CANDIDATE, PT RH POSITIVE Performed at [facility] [address], [place], [place] [address]   Resp Panel by RT-PCR (Flu A&B, Covid) Nasopharyngeal Swab     Status: None   Collection Time: 12/13/20  6:18 PM   Specimen: Nasopharyngeal Swab; Nasopharyngeal(NP) swabs in vial transport medium  Result Value Ref Range   SARS Coronavirus 2 by RT PCR NEGATIVE NEGATIVE   Influenza A by PCR NEGATIVE NEGATIVE   Influenza B by PCR NEGATIVE NEGATIVE    Imaging:  US OB Comp Less 14 Wks  Result Date: 12/13/2020 CLINICAL DATA:  Vaginal bleeding, no beta HCG reported at time of interpretation EXAM: OBSTETRIC <14 WK ULTRASOUND TECHNIQUE: Transabdominal ultrasound was performed for evaluation of the gestation as well as the maternal uterus and adnexal regions. COMPARISON:  December 10, 2019 FINDINGS: Intrauterine gestational sac: Single Yolk sac:  Visualized. Embryo:  Visualized. Cardiac Activity: Visualized. Heart Rate: 169 bpm CRL: 21.4 mm   8 w 5 d                  [place] EDC: July 20, 2021 Subchorionic hemorrhage:  Small volume subchorionic hemorrhage. Maternal uterus/adnexae: Unremarkable IMPRESSION: Single viable intrauterine pregnancy with small volume subchorionic hemorrhage. Electronically Signed   By: Dahlia Bailiff MD   On: 12/13/2020 18:11    MAU Course: Orders Placed This Encounter  Procedures  . Wet prep, genital  . Resp Panel by RT-PCR (Flu A&B, Covid) Nasopharyngeal Swab  . US OB Comp Less 14 Wks  . CBC with Differential/Platelet  . Basic metabolic panel  . CBC  . Diet clear liquid Room service  appropriate? Yes; Fluid consistency: Thin  . Notify physician (specify)  . Vital signs  . Defer vaginal exam for vaginal bleeding or PROM <37 weeks  . Initiate Oral Care Protocol  . Initiate Carrier Fluid Protocol  . SCDs  . Bed rest with bathroom privileges  . Weigh blood loss  . Full code  . Airborne and Contact precautions  . I-Stat Beta hCG blood, ED ([facility], [facility], [facility] only)  . ABO/Rh  . Type and screen [place]  . Place in observation (patient's expected length of stay will be less than 2 midnights)   Meds ordered this encounter  Medications  . lactated ringers bolus 1,000 mL  . HYDROmorphone (DILAUDID) injection 1 mg  . acetaminophen (TYLENOL) tablet 650 mg  . zolpidem (AMBIEN) tablet 5 mg  . docusate sodium (COLACE) capsule 100 mg  . calcium carbonate (TUMS - dosed in mg elemental calcium) chewable tablet 400 mg of elemental calcium  . prenatal multivitamin tablet 1 tablet  . lactated ringers infusion    Assessment/Plan/MDM: 1. Vaginal bleeding in pregnancy, first trimester    35 year old female at [redacted]w[redacted]d by LMP presenting for acute onset vaginal bleeding and abdominal cramping.  Reported early U/S with evidence of intrauterine pregnancy, however do not have records.  Proceeded with evaluation for pregnancy of unknown location/viability, placed orders for CBC, BMP, ABO/Rh, urinalysis, wet prep, GC/CH, and U/S.   Performed sterile speculum exam with Len Blalock, CNM. Upon visualization of cervix, noted white tissue at cervical os.  Tissue removed  with forceps, followed by brisk vaginal bleeding. EBL ~255ml. She remained hemodynamically stable with mild fatigue. IV subsequently placed with liter fluid bolus given.  U/S performed bedside showing single viable intrauterine pregnancy with small subchorionic hemorrhage, discordant with initial concern of POC. Dr. Elgie Congo informed and presented to bedside. Vaginal bleeding slowed with evidence of tissue still within  cervical os of unclear etiology.  Recommended admission for further observation.  Plan: Admit  Follow-up initial labs Repeat H&H in 4 hours Monitor for recurrent bleeding Observe for at least 24 hours pending stability     Darrelyn Hillock, DO Family Medicine PGY-3

## 2020-12-13 NOTE — ED Notes (Signed)
MSE by PA.  Report called to [facility] in MAU.

## 2020-12-14 DIAGNOSIS — O209 Hemorrhage in early pregnancy, unspecified: Secondary | ICD-10-CM

## 2020-12-14 DIAGNOSIS — O208 Other hemorrhage in early pregnancy: Secondary | ICD-10-CM | POA: Diagnosis not present

## 2020-12-14 DIAGNOSIS — O200 Threatened abortion: Secondary | ICD-10-CM | POA: Diagnosis not present

## 2020-12-14 DIAGNOSIS — Z3A09 9 weeks gestation of pregnancy: Secondary | ICD-10-CM | POA: Diagnosis not present

## 2020-12-14 DIAGNOSIS — O4691 Antepartum hemorrhage, unspecified, first trimester: Secondary | ICD-10-CM | POA: Diagnosis not present

## 2020-12-14 DIAGNOSIS — Z3A08 8 weeks gestation of pregnancy: Secondary | ICD-10-CM

## 2020-12-14 MED ORDER — DOCUSATE SODIUM 100 MG PO CAPS: 100.0000 mg | ORAL_CAPSULE | Freq: Every day | ORAL | 0 refills | Status: DC

## 2020-12-14 MED ORDER — ONDANSETRON HCL 4 MG PO TABS: 4.0000 mg | ORAL_TABLET | Freq: Three times a day (TID) | ORAL | 0 refills | Status: AC | PRN

## 2020-12-14 NOTE — Discharge Summary (Addendum)
Physician Discharge Summary  Patient ID: Kathleen Joyce MRN: [id number] DOB/AGE: September 30, 1985 35 y.o.  Admit date: 12/13/2020 Discharge date: 12/14/2020  Admission Diagnoses: vaginal bleeding in first trimester  Discharge Diagnoses: Vaginal bleeding in first trimester  Discharged Condition: stable  Hospital Course: 34yo G1P0@[redacted]w[redacted]d - now dated by early [place] admitted due to vaginal bleeding.  In review- noted cramping and vaginal bleeding on 4/23.  On pelvic exam- small amount of blood noted in vault with "white tissue at cervixal os- approximately 1cm in size."  Prior manipulation of this tissue had caused bleeding, which has now decreased.  No further vaginal bleeding noted.  Confirmed IUP with FHT again performed at bedside prior to discharge.  PT also noting some nausea- Rx for zofran send in.  Pt currently tolerating regular diet. Advised pelvic rest and follow up with her OB in 1wk as scheduled.  Consults: None  Significant Diagnostic Studies: labs: none CLINICAL DATA:  Vaginal bleeding, no beta HCG reported at time of interpretation  EXAM: OBSTETRIC <14 WK ULTRASOUND  TECHNIQUE: Transabdominal ultrasound was performed for evaluation of the gestation as well as the maternal uterus and adnexal regions.  COMPARISON:  December 10, 2019  FINDINGS: Intrauterine gestational sac: Single  Yolk sac:  Visualized.  Embryo:  Visualized.  Cardiac Activity: Visualized.  Heart Rate: 169 bpm  CRL: 21.4 mm   8 w 5 d                  [place] EDC: July 20, 2021  Subchorionic hemorrhage:  Small volume subchorionic hemorrhage.  Maternal uterus/adnexae: Unremarkable  IMPRESSION: Single viable intrauterine pregnancy with small volume subchorionic hemorrhage.   Electronically Signed   By: Dahlia Bailiff MD   On: 12/13/2020 18:11  Treatments: IV hydration  Discharge Exam: Blood pressure 118/64, pulse 71, temperature 98.4 F (36.9 C), resp. rate 17, height 5\' 10"  (1.778 m),  weight 135.4 kg, last menstrual period 10/06/2020, SpO2 100 %. General appearance: alert, cooperative and no distress Resp: clear to auscultation bilaterally Cardio: regular rate and rhythm GI: soft and non-tender, no rebound, no guarding Pelvic: deferred Extremities: extremities normal, atraumatic, no cyanosis or edema Skin: warm and dry  Disposition: Discharge disposition: 01-Home or Self Care       Discharge Instructions    Discharge activity:   Complete by: As directed    Nothing in the vagina x 2 weeks   Discharge activity:  Bathroom / Shower only   Complete by: As directed    Discharge activity:  No Restrictions   Complete by: As directed    Discharge activity:  Up to eat   Complete by: As directed    Discharge activity: Bedrest   Complete by: As directed    Discharge diet:   Complete by: As directed    May return to regular diet   Do not have sex or do anything that might make you have an orgasm   Complete by: As directed      Allergies as of 12/14/2020      Reactions   Banana       Medication List    TAKE these medications   docusate sodium 100 MG capsule Commonly known as: COLACE Take 1 capsule (100 mg total) by mouth daily. Start taking on: December 15, 2020   meloxicam 7.5 MG tablet Commonly known as: Mobic Take 1 tablet (7.5 mg total) by mouth daily.   multivitamin-prenatal 27-0.8 MG Tabs tablet Take 1 tablet by mouth daily at 12 noon.  ondansetron 4 MG tablet Commonly known as: Zofran Take 1 tablet (4 mg total) by mouth every 8 (eight) hours as needed for nausea or vomiting.   tiZANidine 2 MG tablet Commonly known as: ZANAFLEX Take 1 tablet (2 mg total) by mouth every 8 (eight) hours as needed for muscle spasms.       Follow-up [place] [facility] Follow up.   Specialty: Obstetrics and Gynecology Why: This is our office if needed- otherwise, f/u with your Dr. in [place]  information: [place] [id number] [phone]              Signed: Annalee Genta 12/14/2020, 1:43 PM

## 2020-12-14 NOTE — Progress Notes (Signed)
Patient requesting copy of Physician Discharge note. Patient signed Request and authorization for use/disclosure of Protected Health Information, and copy of discharge note printed and provided to patient.

## 2020-12-14 NOTE — Progress Notes (Signed)
FACULTY PRACTICE ANTEPARTUM PROGRESS NOTE  Kathleen Joyce is a 35 y.o. G1P0000 at [redacted]w[redacted]d who is admitted for threatened abortion, heavy vaginal bleeding.  Estimated Date of Delivery: 07/13/21 Fetal presentation is n/a.  Length of Stay:  0 Days. Admitted 12/13/2020  Subjective: Pt seen.  Other than mild nausea she denies abdominal pain.  Pt notes some mild continued vaginal bleeding "the amount of a light period."  The patient denies cramping.  Vitals:  Blood pressure 137/68, pulse 64, temperature 98.1 F (36.7 C), temperature source Oral, resp. rate 18, height 5\' 10"  (1.778 m), weight 135.4 kg, last menstrual period 10/06/2020, SpO2 97 %. Physical Examination: CONSTITUTIONAL: Well-developed, well-nourished female in no acute distress.  HENT:  Normocephalic, atraumatic, External right and left ear normal. Oropharynx is clear and moist EYES: Conjunctivae and EOM are normal.  No scleral icterus.  NECK: Normal range of motion, supple, no masses. SKIN: Skin is warm and dry. No rash noted. Not diaphoretic. No erythema. No pallor. [place]: Alert and oriented to person, place, and time. Normal reflexes, muscle tone coordination. No cranial nerve deficit noted. PSYCHIATRIC: Normal mood and affect. Normal behavior. Normal judgment and thought content. CARDIOVASCULAR: Normal heart rate noted, regular rhythm RESPIRATORY: Effort and breath sounds normal, no problems with respiration noted MUSCULOSKELETAL: Normal range of motion. No edema and no tenderness. ABDOMEN: Soft, nontender, nondistended. CERVIX: deferred    Results for orders placed or performed during the hospital encounter of 12/13/20 (from the past 48 hour(s))  I-Stat Beta hCG blood, ED ([facility], [facility], [facility] only)     Status: Abnormal   Collection Time: 12/13/20  2:34 PM  Result Value Ref Range   I-stat hCG, quantitative >2,000.0 (H) <5 mIU/mL   Comment 3            Comment:   GEST. AGE      CONC.  (mIU/mL)   <=1 WEEK        5 - 50     2  WEEKS       50 - 500     3 WEEKS       100 - 10,000     4 WEEKS     1,000 - 30,000        FEMALE AND NON-PREGNANT FEMALE:     LESS THAN 5 mIU/mL   CBC with Differential/Platelet     Status: None   Collection Time: 12/13/20  5:59 PM  Result Value Ref Range   WBC 9.6 4.0 - 10.5 K/uL   RBC 4.75 3.87 - 5.11 MIL/uL   Hemoglobin 13.3 12.0 - 15.0 g/dL   HCT 41.1 36.0 - 46.0 %   MCV 86.5 80.0 - 100.0 fL   MCH 28.0 26.0 - 34.0 pg   MCHC 32.4 30.0 - 36.0 g/dL   RDW 13.4 11.5 - 15.5 %   Platelets 220 150 - 400 K/uL   nRBC 0.0 0.0 - 0.2 %   Neutrophils Relative % 68 %   Neutro Abs 6.5 1.7 - 7.7 K/uL   Lymphocytes Relative 26 %   Lymphs Abs 2.5 0.7 - 4.0 K/uL   Monocytes Relative 6 %   Monocytes Absolute 0.5 0.1 - 1.0 K/uL   Eosinophils Relative 0 %   Eosinophils Absolute 0.0 0.0 - 0.5 K/uL   Basophils Relative 0 %   Basophils Absolute 0.0 0.0 - 0.1 K/uL   Immature Granulocytes 0 %   Abs Immature Granulocytes 0.02 0.00 - 0.07 K/uL    Comment: Performed at  [facility], [place] [address] [place], [place] [address]  Basic metabolic panel     Status: None   Collection Time: 12/13/20  5:59 PM  Result Value Ref Range   Sodium 135 135 - 145 mmol/L   Potassium 3.7 3.5 - 5.1 mmol/L   Chloride 103 98 - 111 mmol/L   CO2 23 22 - 32 mmol/L   Glucose, Bld 83 70 - 99 mg/dL    Comment: Glucose reference range applies only to samples taken after fasting for at least 8 hours.   BUN 6 6 - 20 mg/dL   Creatinine, Ser 0.71 0.44 - 1.00 mg/dL   Calcium 9.2 8.9 - 10.3 mg/dL   GFR, Estimated >60 >60 mL/min    Comment: (NOTE) Calculated using the CKD-EPI Creatinine Equation (2021)    Anion gap 9 5 - 15    Comment: Performed at [facility] [address], [place], [place] [address]  ABO/Rh     Status: None   Collection Time: 12/13/20  5:59 PM  Result Value Ref Range   ABO/RH(D) A POS    No rh immune globuloin      NOT A RH IMMUNE GLOBULIN CANDIDATE, PT RH POSITIVE Performed at [facility] [address], [place], [place] [address]   Resp Panel by RT-PCR (Flu A&B, Covid) Nasopharyngeal Swab     Status: None   Collection Time: 12/13/20  6:18 PM   Specimen: Nasopharyngeal Swab; Nasopharyngeal(NP) swabs in vial transport medium  Result Value Ref Range   SARS Coronavirus 2 by RT PCR NEGATIVE NEGATIVE    Comment: (NOTE) SARS-CoV-2 target nucleic acids are NOT DETECTED.  The SARS-CoV-2 RNA is generally detectable in upper respiratory specimens during the acute phase of infection. The lowest concentration of SARS-CoV-2 viral copies this assay can detect is 138 copies/mL. A negative result does not preclude SARS-Cov-2 infection and should not be used as the sole basis for treatment or other patient management decisions. A negative result may occur with  improper specimen collection/handling, submission of specimen other than nasopharyngeal swab, presence of viral mutation(s) within the areas targeted by this assay, and inadequate number of viral copies(<138 copies/mL). A negative result must be combined with clinical observations, patient history, and epidemiological information. The expected result is Negative.  Fact Sheet for Patients:  EntrepreneurPulse.com.au  Fact Sheet for Healthcare Providers:  IncredibleEmployment.be  This test is no t yet approved or cleared by the [place] FDA and  has been authorized for detection and/or diagnosis of SARS-CoV-2 by FDA under an Emergency Use Authorization (EUA). This EUA will remain  in effect (meaning this test can be used) for the duration of the [name] declaration under Section 564(b)(1) of the Act, 21 U.S.C.section 360bbb-3(b)(1), unless the authorization is terminated  or revoked sooner.       Influenza A by PCR NEGATIVE NEGATIVE   Influenza B by PCR NEGATIVE NEGATIVE    Comment: (NOTE) The Xpert Xpress SARS-CoV-2/FLU/RSV plus assay is intended as an aid in the  diagnosis of influenza from Nasopharyngeal swab specimens and should not be used as a sole basis for treatment. Nasal washings and aspirates are unacceptable for Xpert Xpress SARS-CoV-2/FLU/RSV testing.  Fact Sheet for Patients: EntrepreneurPulse.com.au  Fact Sheet for Healthcare Providers: IncredibleEmployment.be  This test is not yet approved or cleared by the [place] FDA and has been authorized for detection and/or diagnosis of SARS-CoV-2 by FDA under an Emergency Use Authorization (EUA). This EUA will  remain in effect (meaning this test can be used) for the duration of the [name] declaration under Section 564(b)(1) of the Act, 21 U.S.C. section 360bbb-3(b)(1), unless the authorization is terminated or revoked.  Performed at [facility], [place] [address] [place], [place] [address]   Type and screen [place]     Status: None   Collection Time: 12/13/20  8:12 PM  Result Value Ref Range   ABO/RH(D) A POS    Antibody Screen NEG    Sample Expiration      12/16/2020,2359 Performed at [facility], [place] [address] [place], [place] [address]   CBC     Status: Abnormal   Collection Time: 12/13/20  8:12 PM  Result Value Ref Range   WBC 10.5 4.0 - 10.5 K/uL   RBC 4.10 3.87 - 5.11 MIL/uL   Hemoglobin 11.6 (L) 12.0 - 15.0 g/dL   HCT 35.2 (L) 36.0 - 46.0 %   MCV 85.9 80.0 - 100.0 fL   MCH 28.3 26.0 - 34.0 pg   MCHC 33.0 30.0 - 36.0 g/dL   RDW 13.6 11.5 - 15.5 %   Platelets 191 150 - 400 K/uL   nRBC 0.0 0.0 - 0.2 %    Comment: Performed at [facility], [place] [address] [place], [place] [address]    I have reviewed the patient's current medications.  ASSESSMENT: Active Problems:   Threatened miscarriage in early pregnancy   PLAN: Continued surveillance until early afternoon.  If no further heavy bleeding, discharge home to [place] with close follow up next week.  Pt has appointment already  scheduled.  Again discussed guarded status of the pregnancy.     Lynnda Shields, MD [place] Attending, Center for [facility] 12/14/2020 8:01 AM

## 2020-12-14 NOTE — Discharge Instructions (Signed)
Vaginal Bleeding During Pregnancy, First Trimester A small amount of bleeding from the vagina is common during early pregnancy. This kind of bleeding is also called spotting. Sometimes the bleeding is normal and does not cause problems. At other times, though, bleeding may be a sign of something serious. Normal bleeding in pregnancy can happen:  When the fertilized egg attaches itself to your womb.  When blood vessels change because of the pregnancy.  When you have pelvic exams.  When you have sex. Abnormal bleeding can happen:  When you have an infection.  When you have growths in your womb. The growths are called polyps.  If you are having a miscarriage or at risk of having one.  If you have other problems in your pregnancy. Tell your doctor right away about any bleeding from your vagina. Follow these instructions at home: Watch your bleeding  Watch your condition for any changes. Let your doctor know if you are worried about something.  Try to know what causes your bleeding. Ask yourself these questions: ? Does the bleeding start on its own? ? Does the bleeding start after something is done, such as sex or a pelvic exam?  Use a diary to write the things you see about your bleeding. Write in your diary: ? If the bleeding flows freely without stopping, or if it starts and stops, and then starts again. ? If the bleeding is heavy or light. ? How many pads you use in a day and how much blood is in them.  Tell your doctor if you pass tissue. He or she may want to see it.   Activity  Follow your doctor's instructions about how active you can be. Ask what activities are safe for you.  Do not have sex or orgasms until your doctor says that this is safe.  If needed, make plans for someone to help with your normal activities. General instructions  Take over-the-counter and prescription medicines only as told by your doctor.  Do not take aspirin because it can cause  bleeding.  Do not use tampons.  Do not douche.  Keep all follow-up visits. Contact a doctor if:  You have vaginal bleeding at any time while you are pregnant.  You have cramps.  You have a fever or chills. Get help right away if:  You have very bad cramps in your back or belly (abdomen).  You pass large clots or a lot of tissue from your vagina.  Your bleeding gets worse.  You feel light-headed.  You feel weak.  You pass out (faint).  You have chills.  You are leaking fluid from your vagina.  You have a gush of fluid from your vagina. Summary  Sometimes vaginal bleeding during pregnancy is normal and does not cause problems. At other times, bleeding may be a sign of something serious.  Tell your doctor right away about any bleeding from your vagina.  Follow your doctor's instructions about how active you can be. You may need someone to help you with your normal activities.  Keep all follow-up visits. This information is not intended to replace advice given to you by your health care provider. Make sure you discuss any questions you have with your health care provider. Document Revised: 05/01/2020 Document Reviewed: 05/01/2020 Elsevier Patient Education  2021 [employer]vier Inc.  

## 2020-12-16 LAB — OB RESULTS CONSOLE RPR: RPR: NONREACTIVE

## 2020-12-16 LAB — OB RESULTS CONSOLE HIV ANTIBODY (ROUTINE TESTING): HIV: NONREACTIVE

## 2020-12-16 LAB — OB RESULTS CONSOLE HEPATITIS B SURFACE ANTIGEN
Hepatitis B Surface Ag: POSITIVE
Hepatitis B Surface Ag: UNDETERMINED

## 2020-12-16 LAB — CYTOLOGY - PAP: Pap: NEGATIVE

## 2020-12-16 LAB — SICKLE CELL SCREEN: Sickle Cell Screen: NEGATIVE

## 2020-12-16 LAB — OB RESULTS CONSOLE RUBELLA ANTIBODY, IGM: Rubella: IMMUNE

## 2021-05-27 ENCOUNTER — Encounter: Payer: Self-pay | Admitting: Obstetrics and Gynecology

## 2021-05-27 ENCOUNTER — Other Ambulatory Visit: Payer: Self-pay

## 2021-05-27 ENCOUNTER — Ambulatory Visit (INDEPENDENT_AMBULATORY_CARE_PROVIDER_SITE_OTHER): Payer: Managed Care, Other (non HMO) | Admitting: Obstetrics and Gynecology

## 2021-05-27 VITALS — BP 135/83 | HR 117 | Wt 320.0 lb

## 2021-05-27 DIAGNOSIS — O10913 Unspecified pre-existing hypertension complicating pregnancy, third trimester: Secondary | ICD-10-CM

## 2021-05-27 DIAGNOSIS — O0990 Supervision of high risk pregnancy, unspecified, unspecified trimester: Secondary | ICD-10-CM

## 2021-05-27 DIAGNOSIS — O99213 Obesity complicating pregnancy, third trimester: Secondary | ICD-10-CM

## 2021-05-27 DIAGNOSIS — Z3A33 33 weeks gestation of pregnancy: Secondary | ICD-10-CM

## 2021-05-27 DIAGNOSIS — F419 Anxiety disorder, unspecified: Secondary | ICD-10-CM

## 2021-05-27 DIAGNOSIS — Z6841 Body Mass Index (BMI) 40.0 and over, adult: Secondary | ICD-10-CM

## 2021-05-27 DIAGNOSIS — O10919 Unspecified pre-existing hypertension complicating pregnancy, unspecified trimester: Secondary | ICD-10-CM | POA: Insufficient documentation

## 2021-05-27 HISTORY — DX: Unspecified pre-existing hypertension complicating pregnancy, third trimester: O10.913

## 2021-05-27 NOTE — Progress Notes (Addendum)
   PRENATAL VISIT NOTE  Subjective:  Kathleen Joyce is a 35 y.o. G1P0000 at [redacted]w[redacted]d being seen today for ongoing prenatal care.  She is currently monitored for the following issues for this high-risk pregnancy and has Morbid obesity with BMI of 40.0-44.9, adult ([place]); Class 3 severe obesity with serious comorbidity and body mass index (BMI) of 40.0 to 44.9 in adult [facility]); Depression; Other hyperlipidemia; Iron deficiency anemia; Vitamin D deficiency; Anxiety disorder; Threatened miscarriage in early pregnancy; [redacted] weeks gestation of pregnancy; Obesity complicating pregnancy in third trimester; Supervision of high risk pregnancy, antepartum; and Chronic hypertension in obstetric context in third trimester on their problem list.  Patient doing well with no acute concerns today. She reports no complaints.  Contractions: Not present. Vag. Bleeding: None.  Movement: Present. Denies leaking of fluid.   The following portions of the patient's history were reviewed and updated as appropriate: allergies, current medications, past family history, past medical history, past social history, past surgical history and problem list. Problem list updated.  Objective:   Vitals:   05/27/21 1033  BP: 135/83  Pulse: (!) 117  Weight: (!) 320 lb (145.2 kg)    Fetal Status: Fetal Heart Rate (bpm): 147 Fundal Height: 32 cm Movement: Present     General:  Alert, oriented and cooperative. Patient is in no acute distress.  Skin: Skin is warm and dry. No rash noted.   Cardiovascular: Normal heart rate noted  Respiratory: Normal respiratory effort, no problems with respiration noted  Abdomen: Soft, gravid, appropriate for gestational age.  Pain/Pressure: Present     Pelvic: Cervical exam deferred        Extremities: Normal range of motion.  Edema: Trace  Mental Status:  Normal mood and affect. Normal behavior. Normal judgment and thought content.   Assessment and Plan:  Pregnancy: G1P0000 at [redacted]w[redacted]d  1. Morbid  obesity with BMI of 40.0-44.9, adult ([place]) Will monitor maternal weight  2. [redacted] weeks gestation of pregnancy   3. Obesity complicating pregnancy in third trimester  - [place] MFM OB DETAIL +14 WK; Future - US FETAL BPP W/NONSTRESS; Future  4. Supervision of high risk pregnancy, antepartum Continue routine care, pt compliant with baby ASA Will start fetal growth scan and weekly testing due to [facility], Referral to ob cards due to maternal tachycardia and BP management  - AMB Referral to Cardio Obstetrics - US MFM OB DETAIL +14 WK; Future - US FETAL BPP W/NONSTRESS; Future  5. Chronic hypertension in obstetric context in third trimester See above, will need to determine appropriate time for delivery, currently 39-39.6 weeks, will be dependent on BP and fetal testing  - AMB Referral to Cardio Obstetrics - US MFM OB DETAIL +14 WK; Future - US FETAL BPP W/NONSTRESS; Future  6. Anxiety Pt is aware of family stress and requests counseling, pt does have good spouse support at home. - Amb ref to [place]  Preterm labor symptoms and general obstetric precautions including but not limited to vaginal bleeding, contractions, leaking of fluid and fetal movement were reviewed in detail with the patient.  Please refer to After Visit Summary for other counseling recommendations.   Return in about 2 weeks (around 06/10/2021) for [facility], in person.   Lynnda Shields, MD Faculty Attending Center for [facility]

## 2021-05-28 ENCOUNTER — Telehealth: Payer: Self-pay | Admitting: Clinical

## 2021-05-28 NOTE — Telephone Encounter (Signed)
Attempt to schedule pt per referral; unable to leave voicemail; left MyChart message for pt.

## 2021-05-28 NOTE — BH Specialist Note (Signed)
Integrated Behavioral Health Initial In-Person Visit  MRN: [id number] Name: Kathleen Joyce  Number of [place] Clinician visits:: 1/6 Session Start time: 9:20  Session End time: 10:19 Total time:  59  minutes  Types of Service: Individual psychotherapy  Interpretor:No. Interpretor Name and Language: n/a   Warm Hand Off Completed.        Subjective: Kathleen Joyce is a 35 y.o. female accompanied by  n/a  Patient was referred by Lynnda Shields, MD for anxiety. Patient reports the following symptoms/concerns: Increased anxiety and depression, attributed to current life stress (recent family losses, complicated relationship with mother in palliative care, unknowns in childbirth and postpartum time with first baby, due date moved up with health complications; move to [place] from [place]).  Duration of problem: Increase in current pregnancy; Severity of problem:  moderately severe  Objective: Mood: Anxious and Depressed and Affect: Tearful Risk of harm to self or others: No plan to harm self or others  Life Context: Family and Social: Pt lives with husband; supportive husband and faith community; some supportive family School/Work: Working full-time Self-Care: Recognizing greater need for self-care Life Changes: Current pregnancy; temporary move to [place] for family support postpartum, loss of father and other family losses in past year; mother in palliative care  Patient and/or Family's Strengths/Protective Factors: Social connections, Concrete supports in place (healthy food, safe environments, etc.), and Sense of purpose  Goals Addressed: Patient will: Reduce symptoms of: anxiety and depression Increase knowledge and/or ability of: coping skills and healthy habits  Demonstrate ability to: Increase healthy adjustment to current life circumstances and Continue healthy grieving over family losses  Progress towards Goals: Ongoing  Interventions: Interventions  utilized: Mindfulness or Psychologist, educational and Psychoeducation and/or Health Education  Standardized Assessments completed: GAD-7 and PHQ 9  Patient and/or Family Response: Pt agrees with treatment plan   Patient Centered Plan: Patient is on the following Treatment Plan(s):  IBH  Assessment: 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   Patient may benefit from psychoeducation and brief therapeutic interventions regarding coping with symptoms of depression, anxiety, life stress .  Plan: Follow up with behavioral health clinician on : One week; Call Kayci [place] at [phone], as needed. Behavioral recommendations:  -Continue taking prenatal vitamin daily as prescribed -CALM relaxation breathing exercise as needed throughout the day; consider taking blood pressure before and after to notice any change -Read Postpartum Planner (on After Visit Summary) -Consider viewing video tour of [facility] at [email or web address] and registering for childbirth education class of choice  Referral(s): [facility] [employer] [facility] (In Clinic)   [place], [place]  Depression screen [facility] 2/9 06/02/2021 12/19/2019 07/11/2019 05/14/2018  Decreased Interest 1 2 2  0  Down, Depressed, Hopeless 1 1 3  0  PHQ - 2 Score 2 3 5  0  Altered sleeping 1 3 3  -  Tired, decreased energy 2 2 3  -  Change in appetite 3 1 3  -  Feeling bad or failure about yourself  0 3 2 -  Trouble concentrating 0 3 3 -  Moving slowly or fidgety/restless 2 0 2 -  Suicidal thoughts 1 0 3 -  PHQ-9 Score 11 15 24  -  Difficult doing work/chores - Somewhat difficult Somewhat difficult -   GAD 7 : Generalized Anxiety Score 06/02/2021 12/19/2019  Nervous, Anxious, on Edge 3 2  Control/stop worrying 2 2  Worry too much - different things 2 1  Trouble relaxing 3 3  Restless  2 0  Easily annoyed or irritable 0 1  Afraid - awful might happen 3 1  Total GAD 7 Score 15  10  Anxiety Difficulty - Somewhat difficult

## 2021-06-02 ENCOUNTER — Ambulatory Visit (INDEPENDENT_AMBULATORY_CARE_PROVIDER_SITE_OTHER): Payer: Managed Care, Other (non HMO) | Admitting: Clinical

## 2021-06-02 DIAGNOSIS — F4323 Adjustment disorder with mixed anxiety and depressed mood: Secondary | ICD-10-CM

## 2021-06-02 DIAGNOSIS — F4321 Adjustment disorder with depressed mood: Secondary | ICD-10-CM

## 2021-06-02 NOTE — Patient Instructions (Signed)
[facility] for [facility] [place], [place] [address] [phone] (main office) [phone] [facility] office)  [email or web address]     BRAINSTORMING  Develop a Plan Goals: Provide a way to start conversation about your new life with a baby Assist parents in recognizing and using resources within their reach Help pave the way before birth for an easier period of transition afterwards.  Make a list of the following information to keep in a central location: Full name of Mom and Partner: _____________________________________________ 70 full name and Date of Birth: ___________________________________________ Home Address: ___________________________________________________________ ________________________________________________________________________ Home Phone: ____________________________________________________________ Parents' cell numbers: _____________________________________________________ ________________________________________________________________________ Name and contact info for OB: ______________________________________________ Name and contact info for Pediatrician:________________________________________ Contact info for Lactation Consultants: ________________________________________  REST and SLEEP *You each need at least 4-5 hours of uninterrupted sleep every day. Write specific names and contact information.* How are you going to rest in the postpartum period? While partner's home? When partner returns to work? When you both return to work? Where will your baby sleep? Who is available to help during the day? Evening? Night? Who could move in for a period to help support you? What are some ideas to help you get enough  sleep? __________________________________________________________________________________________________________________________________________________________________________________________________________________________________________ NUTRITIOUS FOOD AND DRINK *Plan for meals before your baby is born so you can have healthy food to eat during the immediate postpartum period.* Who will look after breakfast? Lunch? Dinner? List names and contact information. Brainstorm quick, healthy ideas for each meal. What can you do before baby is born to prepare meals for the postpartum period? How can others help you with meals? Which grocery stores provide online shopping and delivery? Which restaurants offer take-out or delivery options? ______________________________________________________________________________________________________________________________________________________________________________________________________________________________________________________________________________________________________________________________________________________________________________________________________  CARE FOR MOM *It's important that mom is cared for and pampered in the postpartum period. Remember, the most important ways new mothers need care are: sleep, nutrition, gentle exercise, and time off.* Who can come take care of mom during this period? Make a list of people with their contact information. List some activities that make you feel cared for, rested, and energized? Who can make sure you have opportunities to do these things? Does mom have a space of her very own within your home that's just for her? Make a "[facility]" where she can be comfortable, rest, and renew herself  daily. ______________________________________________________________________________________________________________________________________________________________________________________________________________________________________________________________________________________________________________________________________________________________________________________________________    CARE FOR AND FEEDING BABY *Knowledgeable and encouraging people will offer the best support with regard to feeding your baby.* Educate yourself and choose the best feeding option for your baby. Make a list of people who will guide, support, and be a resource for you as your care for and feed your baby. (Friends that have breastfed or are currently breastfeeding, lactation consultants, breastfeeding support groups, etc.) Consider a postpartum doula. (These websites can give you information: dona.org & BuyingShow.es) Seek out local breastfeeding resources like the breastfeeding support group at [employer] or [employer]. ______________________________________________________________________________________________________________________________________________________________________________________________________________________________________________________________________________________________________________________________________________________________________________________________________  Verner Chol AND ERRANDS Who can help with a thorough cleaning before baby is born? Make a list of people who will help with housekeeping and chores, like laundry, light cleaning, dishes, bathrooms, etc. Who can run some errands for you? What can you do to make sure you are stocked with basic supplies before baby is born? Who is going to do the  shopping? ______________________________________________________________________________________________________________________________________________________________________________________________________________________________________________________________________________________________________________________________________________________________________________________________________     Family Adjustment *Nurture yourselves.it helps parents be more loving and allows for better bonding with their child.* What sorts of things do you and  doing together? Which activities help you to connect and strengthen your relationship? Make a list of those things. Make a list of people whom you trust to care for your baby so you can have some time together as a couple. What types of things help partner feel connected to Mom? Make a list. What needs will partner have in order to bond with baby? Other children? Who will care for them when you go into labor and while you are in the hospital? Think about what the needs of your older children might be. Who can help you meet those needs? In what ways are you helping them prepare for bringing baby home? List some specific strategies you have for family adjustment. _______________________________________________________________________________________________________________________________________________________________________________________________________________________________________________________________________________________________________________________________________________  SUPPORT *Someone who can empathize with experiences normalizes your problems and makes them more bearable.* Make a list of other friends, neighbors, and/or co-workers you know with infants (and small children, if applicable) with whom you can connect. Make a list of local or online support groups, mom groups, etc. in which you can be  involved. ______________________________________________________________________________________________________________________________________________________________________________________________________________________________________________________________________________________________________________________________________________________________________________________________________  Childcare Plans Investigate and plan for childcare if mom is returning to work. Talk about mom's concerns about her transition back to work. Talk about partner's concerns regarding this transition.  Mental Health *Your mental health is one of the highest priorities for a pregnant or postpartum mom.* 1 in 5 women experience anxiety and/or depression from the time of conception through the first year after birth. Postpartum Mood Disorders are the #1 complication of pregnancy and childbirth and the suffering experienced by these mothers is not necessary! These illnesses are temporary and respond well to treatment, which often includes self-care, social support, talk therapy, and medication when needed. Women experiencing anxiety and depression often say things like: "I'm supposed to be happy.why do I feel so sad?", "Why can't I snap out of it?", "I'm having thoughts that scare me." There is no need to be embarrassed if you are feeling these symptoms: Overwhelmed, anxious, angry, sad, guilty, irritable, hopeless, exhausted but can't sleep You are NOT alone. You are NOT to blame. With help, you WILL be well. Where can I find help? Medical professionals such as your OB, midwife, gynecologist, family practitioner, primary care provider, pediatrician, or mental health providers; [facility] support groups: Feelings After Birth, Breastfeeding Support Group, Baby and Me Group, and Fit 4 Two exercise classes. You have permission to ask for help. It will confirm your feelings, validate your experiences,  share/learn coping strategies, and gain support and encouragement as you heal. You are important! BRAINSTORM Make a list of local resources, including resources for mom and for partner. Identify support groups. Identify people to call late at night - include names and contact info. Talk with partner about perinatal mood and anxiety disorders. Talk with your OB, midwife, and doula about baby blues and about perinatal mood and anxiety disorders. Talk with your pediatrician about perinatal mood and anxiety disorders.   Support & Sanity Savers   What do you really need?  Basics In preparing for a new baby, many expectant parents spend hours shopping for baby clothes, decorating the nursery, and deciding which car seat to buy. Yet most don't think much about what the reality of parenting a newborn will be like, and what they need to make it through that. So, here is the advice of experienced parents. We know you'll read this, and think "they're exaggerating, I don't really need that." Just trust us on these, OK? Plan for all of   this, and if it turns out you don't need it, come back and teach us how you did it!  Must-Haves (Once baby's survival needs are met, make sure you attend to your own survival needs!) Sleep An average newborn sleeps 16-18 hours per day, over 6-7 sleep periods, rarely more than three hours at a time. It is normal and healthy for a newborn to wake throughout the night... but really hard on parents!! Naps. Prioritize sleep above any responsibilities like: cleaning house, visiting friends, running errands, etc.  Sleep whenever baby sleeps. If you can't nap, at least have restful times when baby eats. The more rest you get, the more patient you will be, the more emotionally stable, and better at solving problems.  Food You may not have realized it would be difficult to eat when you have a newborn. Yet, when we talk to countless new parents, they say things like "it may be 2:00 pm  when I realize I haven't had breakfast yet." Or "every time we sit down to dinner, baby needs to eat, and my food gets cold, so I don't bother to eat it." Finger food. Before your baby is born, stock up with one months' worth of food that: 1) you can eat with one hand while holding a baby, 2) doesn't need to be prepped, 3) is good hot or cold, 4) doesn't spoil when left out for a few hours, and 5) you like to eat. Think about: nuts, dried fruit, Clif bars, pretzels, jerky, gogurt, baby carrots, apples, bananas, crackers, cheez-n-crackers, string cheese, hot pockets or frozen burritos to microwave, garden burgers and breakfast pastries to put in the toaster, yogurt drinks, etc. Restaurant Menus. Make lists of your favorite restaurants & menu items. When family/friends want to help, you can give specific information without much thought. They can either bring you the food or send gift cards for just the right meals. Freezer Meals.  Take some time to make a few meals to put in the freezer ahead of time.  Easy to freeze meals can be anything such as soup, lasagna, chicken pie, or spaghetti sauce. Set up a Meal Schedule.  Ask friends and family to sign up to bring you meals during the first few weeks of being home. (It can be passed around at baby showers!) You have no idea how helpful this will be until you are in the throes of parenting.  www.takethemameal.com is a great website to check out. Emotional Support Know who to call when you're stressed out. Parenting a newborn is very challenging work. There are times when it totally overwhelms your normal coping abilities. EVERY NEW PARENT NEEDS TO HAVE A PLAN FOR WHO TO CALL WHEN THEY JUST CAN'T COPE ANY MORE. (And it has to be someone other than the baby's other parent!) Before your baby is born, come up with at least one person you can call for support - write their phone number down and post it on the refrigerator. Anxiety & Sadness. Baby blues are normal after  pregnancy; however, there are more severe types of anxiety & sadness which can occur and should not be ignored.  They are always treatable, but you have to take the first step by reaching out for help. [facility] offers a "Mom Talk" group which meets every Tuesday from 10 am - 11 am.  This group is for new moms who need support and connection after their babies are born.  Call [phone].  Really, Really Helpful (Plan for them!   Make sure these happen often!!) Physical Support with Taking Care of Yourselves Asking friends and family. Before your baby is born, set up a schedule of people who can come and visit and help out (or ask a friend to schedule for you). Any time someone says "let me know what I can do to help," sign them up for a day. When they get there, their job is not to take care of the baby (that's your job and your joy). Their job is to take care of you!  Postpartum doulas. If you don't have anyone you can call on for support, look into postpartum doulas:  professionals at helping parents with caring for baby, caring for themselves, getting breastfeeding started, and helping with household tasks. www.padanc.org is a helpful website for learning about doulas in our area. Peer Support / Parent Groups Why: One of the greatest ideas for new parents is to be around other new parents. Parent groups give you a chance to share and listen to others who are going through the same season of life, get a sense of what is normal infant development by watching several babies learn and grow, share your stories of triumph and struggles with empathetic ears, and forgive your own mistakes when you realize all parents are learning by trial and error. Where to find: There are many places you can meet other new parents throughout our community.  [facility] offers the following classes for new moms and their little ones:  Baby and Me (Birth to Crawling) and Breastfeeding Support Group. Go to  www.[email or web address] or call [phone] for more information. Time for your Relationship It's easy to get so caught up in meeting baby's immediate needs that it's hard to find time to connect with your partner, and meet the needs of your relationship. It's also easy to forget what "quality time with your partner" actually looks like. If you take your baby on a date, you'd be amazed how much of your couple time is spent feeding the baby, diapering the baby, admiring the baby, and talking about the baby. Dating: Try to take time for just the two of you. Babysitter tip: Sometimes when moms are breastfeeding a newborn, they find it hard to figure out how to schedule outings around baby's unpredictable feeding schedules. Have the babysitter come for a three hour period. When she comes over, if baby has just eaten, you can leave right away, and come back in two hours. If baby hasn't fed recently, you start the date at home. Once baby gets hungry and gets a good feeding in, you can head out for the rest of your date time. Date Nights at Home: If you can't get out, at least set aside one evening a week to prioritize your relationship: whenever baby dozes off or doesn't have any immediate needs, spend a little time focusing on each other. Potential conflicts: The main relationship conflicts that come up for new parents are: issues related to sexuality, financial stresses, a feeling of an unfair division of household tasks, and conflicts in parenting styles. The more you can work on these issues before baby arrives, the better!  Fun and Frills (Don't forget these. and don't feel guilty for indulging in them!) Everyone has something in life that is a fun little treat that they do just for themselves. It may be: reading the morning paper, or going for a daily jog, or having coffee with a friend once a week, or going to a movie on Friday nights,   Friday nights, or fine chocolates, or bubble baths, or curling up with a good  book. Unless you do fun things for yourself every now and then, it's hard to have the energy for fun with your baby. Whatever your "special" treats are, make sure you find a way to continue to indulge in them after your baby is born. These special moments can recharge you, and allow you to return to baby with a new joy   PERINATAL MOOD DISORDERS: [place]   Emergency and Crisis Resources:  If you are an imminent risk to self or others, are experiencing intense personal distress, and/or have noticed significant changes in activities of daily living, call:  [facility]: [place]: [place]: [phone] Or visit the following crisis centers: Local Emergency Departments [facility]: [address] [place]. Hours: 8:30AM-5PM. Insurance Accepted: Medicaid, Medicare, and Uninsured.  [facility]  [address], [place] Mon-Friday 8am-3pm  [phone]                                                                                    Non-Crisis Resources: To identify specific providers that are covered by your insurance, contact your insurance company or local agencies: [place]: [phone] [facility]--[place] and [place]: [phone] [place]: [phone] Postpartum Support International- [place] 1-6692683522                                                      Outpatient therapy and medication management providers:  [facility] [phone] Hours: 9AM-5PM  Insurance Accepted: Alben Spittle, Lorella Nimrod, Freddrick March, [place], Medicare  [facility] ([place]) [phone] Hours: 8AM-5PM  nsurance Accepted: All insurances EXCEPT AARP, [place], [place], and [place]: [phone]             Hours: 8AM-8PM Insurance  Accepted: Cristal Ford, Freddrick March, [place], Medicare, [phone] [facility]: [phone] Hours: 8:30AM-7PM Insurance Accepted: Cristal Ford, Medicaid, Medicare, Tricare, [employer] Counseling:  [place] Accepted:  Holland Falling, Lorella Nimrod, [facility], [place], [facility] [phone] Hours: 9AM-5:30PM Insurance Accepted: Alben Spittle, Charlotte Crumb, and Medicaid, Medicare, [employer] [facility] Counsel[facility]:  [phone] Hours: 9am-5pm Insurance Accepted: BCBS; they do not accept Medicaid/Medicare [place]: [phone] Hours: 9am-9pm Insurance Accepted: All major insurance including Medicaid and Medicare [facility]: [phone] Hours: 9AM-5:30PM Insurance Accepted: All insurances EXCEPT Medicaid and Medicare. [facility]: [phone]  Parenting [place]: [phone] [facility]:  [place] (support for children in the NICU and/or with special needs), [place] [employer]: [phone]                                                                                     Online Resources: Postpartum Support International: http://jones-[email or web address]/  800-944-4PPD 2Moms Supporting Moms:  www.momssupportingmoms.net

## 2021-06-03 ENCOUNTER — Other Ambulatory Visit: Payer: Managed Care, Other (non HMO)

## 2021-06-03 ENCOUNTER — Other Ambulatory Visit: Payer: Self-pay

## 2021-06-03 NOTE — BH Specialist Note (Signed)
Integrated Behavioral Health via Telemedicine Visit  06/03/2021 YOONA ISHII [id number]  Number of [place] visits: 2 Session Start time: 10:50  Session End time: 11:59 Total time:  22  Referring Provider: Lynnda Shields, MD Patient/Family location: [facility] Provider location: [facility]  All persons participating in visit: Patient Karrington Bonillas and [place]   Types of Service: Individual psychotherapy and Video visit  I connected with [place] and/or Yarelie Z Quam's  n/a  via  Telephone or Geologist, engineering  (Video is Tree surgeon) and verified that I am speaking with the correct person using two identifiers. Discussed confidentiality: Yes   I discussed the limitations of telemedicine and the availability of in person appointments.  Discussed there is a possibility of technology failure and discussed alternative modes of communication if that failure occurs.  I discussed that engaging in this telemedicine visit, they consent to the provision of behavioral healthcare and the services will be billed under their insurance.  Patient and/or legal guardian expressed understanding and consented to Telemedicine visit: Yes   Presenting Concerns: Patient and/or family reports the following symptoms/concerns: Continued life stress; reduced symptoms of anxiety/depession by focusing on acceptance, honesty with friends/family, and increased self-coping strategies. Duration of problem: Current pregnancy; Severity of problem:  moderately severe  Patient and/or Family's Strengths/Protective Factors: Social connections, Social and Emotional competence, Concrete supports in place (healthy food, safe environments, etc.), Sense of purpose, and Physical Health (exercise, healthy diet, medication compliance, etc.)  Goals Addressed: Patient will:  Reduce symptoms of: anxiety,  depression, and stress   Increase knowledge and/or ability of: stress reduction   Demonstrate ability to: Increase healthy adjustment to current life circumstances  Progress towards Goals: Ongoing  Interventions: Interventions utilized:  Armed forces logistics/support/administrative officer and Supportive Reflection Standardized Assessments completed: Not Needed  Patient and/or Family Response: Pt agrees with treatment plan  Assessment: 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   Patient may benefit from continued psychoeducation and brief therapeutic interventions regarding coping with symptoms of anxiety, depression, stress .  Plan: Follow up with behavioral health clinician on : One week Behavioral recommendations:  -Continue setting healthy boundaries, including discussion with husband and family regarding newborn visitation -Continue with plan to allow supportive friends to provide practical support during baby shower and postpartum -Continue taking prenatal vitamin daily -Continue using relaxation breathing exercise twice daily(morning/bedtime); as needed throughout the day -Continue to allow and accept feelings as they come up Referral(s): [place] (In Clinic)  I discussed the assessment and treatment plan with the patient and/or parent/guardian. They were provided an opportunity to ask questions and all were answered. They agreed with the plan and demonstrated an understanding of the instructions.   They were advised to call back or seek an in-person evaluation if the symptoms worsen or if the condition fails to improve as anticipated.  [name], LCSW  Depression screen [facility] 2/9 06/02/2021 12/19/2019 07/11/2019 05/14/2018  Decreased Interest 1 2 2  0  Down, Depressed, Hopeless 1 1 3  0  PHQ - 2 Score 2 3 5  0  Altered sleeping 1 3 3  -  Tired, decreased energy 2 2 3  -  Change in appetite 3 1 3  -  Feeling bad or  failure about yourself  0 3 2 -  Trouble concentrating 0 3 3 -  Moving slowly or fidgety/restless 2 0 2 -  Suicidal thoughts 1 0 3 -  PHQ-9 Score 11 15 24  -  Difficult doing work/chores - Somewhat difficult Somewhat difficult -   GAD 7 : Generalized Anxiety Score 06/02/2021 12/19/2019  Nervous, Anxious, on Edge 3 2  Control/stop worrying 2 2  Worry too much - different things 2 1  Trouble relaxing 3 3  Restless 2 0  Easily annoyed or irritable 0 1  Afraid - awful might happen 3 1  Total GAD 7 Score 15 10  Anxiety Difficulty - Somewhat difficult

## 2021-06-08 ENCOUNTER — Other Ambulatory Visit: Payer: Self-pay

## 2021-06-08 ENCOUNTER — Ambulatory Visit: Payer: Managed Care, Other (non HMO) | Admitting: *Deleted

## 2021-06-08 ENCOUNTER — Ambulatory Visit (INDEPENDENT_AMBULATORY_CARE_PROVIDER_SITE_OTHER): Payer: Managed Care, Other (non HMO)

## 2021-06-08 VITALS — BP 119/86 | HR 97 | Wt 320.4 lb

## 2021-06-08 DIAGNOSIS — O10913 Unspecified pre-existing hypertension complicating pregnancy, third trimester: Secondary | ICD-10-CM

## 2021-06-08 DIAGNOSIS — O99213 Obesity complicating pregnancy, third trimester: Secondary | ICD-10-CM

## 2021-06-08 MED ORDER — PRENATAL VITAMIN 27-0.8 MG PO TABS: 1.0000 | ORAL_TABLET | Freq: Every day | ORAL | 6 refills | Status: AC

## 2021-06-08 NOTE — Progress Notes (Signed)
Pt informed that the ultrasound is considered a limited OB ultrasound and is not intended to be a complete ultrasound exam.  Patient also informed that the ultrasound is not being completed with the intent of assessing for fetal or placental anomalies or any pelvic abnormalities.  Explained that the purpose of today's ultrasound is to assess for presentation, BPP and amniotic fluid volume.  Patient acknowledges the purpose of the exam and the limitations of the study.    Pt voiced concern of lack of weight gain. Last US done in [place] @ [redacted] wks [facility]. EFW was 18%.   Detail [place] scheduled on 10/26 @ 1245 per order Dr. Elgie Congo.

## 2021-06-10 ENCOUNTER — Ambulatory Visit (INDEPENDENT_AMBULATORY_CARE_PROVIDER_SITE_OTHER): Payer: 59 | Admitting: Clinical

## 2021-06-10 DIAGNOSIS — F4323 Adjustment disorder with mixed anxiety and depressed mood: Secondary | ICD-10-CM | POA: Diagnosis not present

## 2021-06-10 DIAGNOSIS — F4321 Adjustment disorder with depressed mood: Secondary | ICD-10-CM

## 2021-06-11 NOTE — BH Specialist Note (Signed)
Integrated Behavioral Health via Telemedicine Visit  06/11/2021 Kathleen Joyce [id number]  Number of [place] visits: 3 Session Start time: 10:47 Session End time: 11:52 Total time:  77  Referring Provider: Lynnda Shields, MD Patient/Family location: Home [facility] Provider location: [facility]  All persons participating in visit: [name] and Kathleen Joyce   Types of Service: Individual psychotherapy and Video visit  I connected with Kathleen Joyce and/or Kathleen Joyce's  n/a  via  Telephone or Geologist, engineering  (Video is Tree surgeon) and verified that I am speaking with the correct person using two identifiers. Discussed confidentiality: Yes   I discussed the limitations of telemedicine and the availability of in person appointments.  Discussed there is a possibility of technology failure and discussed alternative modes of communication if that failure occurs.  I discussed that engaging in this telemedicine visit, they consent to the provision of behavioral healthcare and the services will be billed under their insurance.  Patient and/or legal guardian expressed understanding and consented to Telemedicine visit: Yes   Presenting Concerns: Patient and/or family reports the following symptoms/concerns: Stressful incident regarding mother's healthcare in palliative care, leading to increased anxiety, crying, depressed mood.  Duration of problem: Past week increase; Severity of problem:  moderately severe  Patient and/or Family's Strengths/Protective Factors: Social connections, Social and Emotional competence, Concrete supports in place (healthy food, safe environments, etc.), Sense of purpose, and Physical Health (exercise, healthy diet, medication compliance, etc.)  Goals Addressed: Patient will:  Reduce symptoms of: anxiety, depression, and stress    Demonstrate ability to: Increase healthy adjustment to current life circumstances  Progress towards Goals: Ongoing  Interventions: Interventions utilized:  Supportive Reflection Standardized Assessments completed: Not Needed  Patient and/or Family Response: Pt agrees with treatment plan  Assessment: 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   Patient may benefit from psychoeducation and brief therapeutic interventions regarding coping with symptoms of anxiety, depression, stress  Plan: Follow up with behavioral health clinician on : One week Behavioral recommendations:  -Continue using self-coping strategies that have remained helpful to manage emotions (healthy boundaries, advocating for mother's appropriate healthcare, relaxation breathing, etc.) -Continue taking prenatal vitamin daily Referral(s): [place] (In Clinic)  I discussed the assessment and treatment plan with the patient and/or parent/guardian. They were provided an opportunity to ask questions and all were answered. They agreed with the plan and demonstrated an understanding of the instructions.   They were advised to call back or seek an in-person evaluation if the symptoms worsen or if the condition fails to improve as anticipated.  Kathleen Fair, LCSW  Depression screen [facility] 2/9 06/15/2021 06/02/2021 12/19/2019 07/11/2019 05/14/2018  Decreased Interest 2 1 2 2  0  Down, Depressed, Hopeless 2 1 1 3  0  PHQ - 2 Score 4 2 3 5  0  Altered sleeping 3 1 3 3  -  Tired, decreased energy 1 2 2 3  -  Change in appetite 1 3 1 3  -  Feeling bad or failure about yourself  0 0 3 2 -  Trouble concentrating 0 0 3 3 -  Moving slowly or fidgety/restless 0 2 0 2 -  Suicidal thoughts 0 1 0 3 -  PHQ-9 Score 9 11 15 24  -  Difficult doing work/chores - - Somewhat difficult Somewhat difficult -   GAD 7 : Generalized Anxiety Score 06/15/2021 06/02/2021  12/19/2019  Nervous, Anxious, on Edge 2 3 2   Control/stop worrying 0 2 2  Worry too much - different things 2 2 1   Trouble relaxing 3 3 3   Restless 1 2 0  Easily annoyed or irritable 1 0 1  Afraid - awful might happen 3 3 1   Total GAD 7 Score 12 15 10   Anxiety Difficulty - - Somewhat difficult

## 2021-06-15 ENCOUNTER — Ambulatory Visit (INDEPENDENT_AMBULATORY_CARE_PROVIDER_SITE_OTHER): Payer: Managed Care, Other (non HMO)

## 2021-06-15 ENCOUNTER — Encounter: Payer: Self-pay | Admitting: *Deleted

## 2021-06-15 ENCOUNTER — Other Ambulatory Visit: Payer: Self-pay

## 2021-06-15 ENCOUNTER — Ambulatory Visit: Payer: Managed Care, Other (non HMO) | Admitting: *Deleted

## 2021-06-15 ENCOUNTER — Other Ambulatory Visit: Payer: Self-pay | Admitting: Family Medicine

## 2021-06-15 ENCOUNTER — Ambulatory Visit (INDEPENDENT_AMBULATORY_CARE_PROVIDER_SITE_OTHER): Payer: Managed Care, Other (non HMO) | Admitting: Family Medicine

## 2021-06-15 VITALS — BP 117/80 | HR 98 | Wt 322.1 lb

## 2021-06-15 DIAGNOSIS — O0990 Supervision of high risk pregnancy, unspecified, unspecified trimester: Secondary | ICD-10-CM

## 2021-06-15 DIAGNOSIS — O10913 Unspecified pre-existing hypertension complicating pregnancy, third trimester: Secondary | ICD-10-CM | POA: Diagnosis not present

## 2021-06-15 DIAGNOSIS — O0993 Supervision of high risk pregnancy, unspecified, third trimester: Secondary | ICD-10-CM

## 2021-06-15 DIAGNOSIS — Z23 Encounter for immunization: Secondary | ICD-10-CM

## 2021-06-15 DIAGNOSIS — O99213 Obesity complicating pregnancy, third trimester: Secondary | ICD-10-CM

## 2021-06-15 DIAGNOSIS — Z3A34 34 weeks gestation of pregnancy: Secondary | ICD-10-CM

## 2021-06-15 NOTE — Progress Notes (Signed)
Tdap vaccine was administered into right deltoid without any complications. All questions and concerns were answered.   Zella Richer, [place]  06/15/21

## 2021-06-15 NOTE — Progress Notes (Addendum)
Subjective:  Kathleen Joyce is a 35 y.o. G1P0000 at [redacted]w[redacted]d being seen today for ongoing prenatal care.  She is currently monitored for the following issues for this high-risk pregnancy and has Morbid obesity with BMI of 40.0-44.9, adult ([place]); Class 3 severe obesity with serious comorbidity and body mass index (BMI) of 40.0 to 44.9 in adult [facility]); Depression; Other hyperlipidemia; Iron deficiency anemia; Vitamin D deficiency; Anxiety disorder; Threatened miscarriage in early pregnancy; [redacted] weeks gestation of pregnancy; Obesity complicating pregnancy in third trimester; Supervision of high risk pregnancy, antepartum; and Chronic hypertension in obstetric context in third trimester on their problem list.  Patient reports no complaints.  Contractions: Not present. Vag. Bleeding: None.  Movement: Present. Denies leaking of fluid.   The following portions of the patient's history were reviewed and updated as appropriate: allergies, current medications, past family history, past medical history, past social history, past surgical history and problem list. Problem list updated.  Objective:   Vitals:   06/15/21 0844  BP: 117/80  Pulse: 98  Weight: (!) 322 lb 1.6 oz (146.1 kg)    Fetal Status: Fetal Heart Rate (bpm): 141   Movement: Present     General:  Alert, oriented and cooperative. Patient is in no acute distress.  Skin: Skin is warm and dry. No rash noted.   Cardiovascular: Normal heart rate noted  Respiratory: Normal respiratory effort, no problems with respiration noted  Abdomen: Soft, gravid, appropriate for gestational age. Pain/Pressure: Present     Pelvic: Vag. Bleeding: None     Cervical exam deferred        Extremities: Normal range of motion.  Edema: Trace  Mental Status: Normal mood and affect. Normal behavior. Normal judgment and thought content.     Assessment and Plan:  Pregnancy: G1P0000 at [redacted]w[redacted]d  1. [redacted] weeks gestation of pregnancy 2. Supervision of high risk  pregnancy, antepartum Of note, patient transferred care from [place] to [place] in order to be closer to her family. Previously had prenatal care in [place]. Records in media tab. Reports she had her 28 week labs and her GTT was normal. Patient to re-request records to be sent here for 28 week visit. Otherwise doing well. Has NST today.  - Does not think had Tdap there - will give today - taking ASA, continue  2. Chronic hypertension in obstetric context in third trimester Not on meds. Today BP normotensive at 117/80. Checks BP at home every day. Discussed parameters and symptoms of when to reach out and when to go to hospital/MAU. Given cHTN and well controlled will plan for IOL at 39 weeks.  - IOL scheduled at 39 weeks -Discussed with patient that if any of the testing (NST or BPP) or BP (not controlled or needing to start meds) will recommend at that time to move up IOL. Patient and partner expresses understanding - IOL orders placed  3. Administrative/records request - Patient will get records for 28 week visit sent to [place]. Has had GTT there per patient and she states she passed.  - Follow up records, confirm has 28 hr testing, and enter into [facility] once arrive    Preterm labor symptoms and general obstetric precautions including but not limited to vaginal bleeding, contractions, leaking of fluid and fetal movement were reviewed in detail with the patient. Please refer to After Visit Summary for other counseling recommendations.  Return in about 1 week (around 06/22/2021) for HROB, any MD/DO, needs BP check and confirm lab records here from  [place].  Renard Matter, MD, MPH OB Fellow, Faculty Practice

## 2021-06-15 NOTE — Progress Notes (Signed)
IOL orders placed

## 2021-06-17 ENCOUNTER — Ambulatory Visit: Payer: Managed Care, Other (non HMO) | Attending: Obstetrics and Gynecology

## 2021-06-17 ENCOUNTER — Other Ambulatory Visit: Payer: Self-pay

## 2021-06-17 ENCOUNTER — Ambulatory Visit: Payer: Managed Care, Other (non HMO) | Admitting: Clinical

## 2021-06-17 ENCOUNTER — Ambulatory Visit: Payer: Managed Care, Other (non HMO) | Admitting: *Deleted

## 2021-06-17 ENCOUNTER — Encounter: Payer: Self-pay | Admitting: *Deleted

## 2021-06-17 VITALS — BP 124/74 | HR 119

## 2021-06-17 DIAGNOSIS — O99213 Obesity complicating pregnancy, third trimester: Secondary | ICD-10-CM | POA: Diagnosis present

## 2021-06-17 DIAGNOSIS — O10913 Unspecified pre-existing hypertension complicating pregnancy, third trimester: Secondary | ICD-10-CM | POA: Diagnosis present

## 2021-06-17 DIAGNOSIS — O0990 Supervision of high risk pregnancy, unspecified, unspecified trimester: Secondary | ICD-10-CM

## 2021-06-17 DIAGNOSIS — F4321 Adjustment disorder with depressed mood: Secondary | ICD-10-CM

## 2021-06-17 DIAGNOSIS — F4323 Adjustment disorder with mixed anxiety and depressed mood: Secondary | ICD-10-CM

## 2021-06-18 NOTE — BH Specialist Note (Signed)
Integrated Behavioral Health via Telemedicine Visit  06/18/2021 [name] [id number]  Number of [place] visits: 4 Session Start time: 10:45  Session End time: 11:49 Total time:  38  Referring Provider: Lynnda Shields, MD Patient/Family location: Home [facility] Provider location: [facility] for [facility] at [facility]  All persons participating in visit: [name] and [place]   Types of Service: Individual psychotherapy and Video visit  I connected with [place] and/or Laine Z Marcello's  n/a  via  Telephone or Geologist, engineering  (Video is Tree surgeon) and verified that I am speaking with the correct person using two identifiers. Discussed confidentiality: Yes   I discussed the limitations of telemedicine and the availability of in person appointments.  Discussed there is a possibility of technology failure and discussed alternative modes of communication if that failure occurs.  I discussed that engaging in this telemedicine visit, they consent to the provision of behavioral healthcare and the services will be billed under their insurance.  Patient and/or legal guardian expressed understanding and consented to Telemedicine visit: Yes   Presenting Concerns: Patient and/or family reports the following symptoms/concerns: Mother moved to hospice care in the past week.  Duration of problem: Past week; Severity of problem:  moderately severe  Patient and/or Family's Strengths/Protective Factors: Social connections, Social and Emotional competence, Concrete supports in place (healthy food, safe environments, etc.), Sense of purpose, and Physical Health (exercise, healthy diet, medication compliance, etc.)  Goals Addressed: Patient will:  Manage symptoms of: anxiety, depression, and stress   Demonstrate ability to: Increase healthy adjustment to current life  circumstances  Progress towards Goals: Ongoing  Interventions: Interventions utilized:  Supportive Reflection Standardized Assessments completed: Not Needed  Patient and/or Family Response: Pt agrees to ongoing treatment plan  Assessment: Patient currently experiencing Adjustment disorder with mixed anxious and depressed mood.   Patient may benefit from continued psychoeducation and brief therapeutic interventions regarding managing with symptoms of depression, anxiety, stress .  Plan: Follow up with behavioral health clinician on : One week Behavioral recommendations:  -Pack hospital bag oday; see "What to bring" list on [email or web address], as needed -Consider requesting help with packing up mother's home from friends -Pick out pictures of mother use use later, as needed -Consider making list of work that can be done virtually, while being present for mother in her hospice room for the remainder of the week Referral(s): [place] (In Clinic)  I discussed the assessment and treatment plan with the patient and/or parent/guardian. They were provided an opportunity to ask questions and all were answered. They agreed with the plan and demonstrated an understanding of the instructions.   They were advised to call back or seek an in-person evaluation if the symptoms worsen or if the condition fails to improve as anticipated.  Caroleen Hamman Shamila Lerch, LCSW

## 2021-06-22 ENCOUNTER — Ambulatory Visit (INDEPENDENT_AMBULATORY_CARE_PROVIDER_SITE_OTHER): Payer: Managed Care, Other (non HMO)

## 2021-06-22 ENCOUNTER — Other Ambulatory Visit: Payer: Self-pay

## 2021-06-22 ENCOUNTER — Ambulatory Visit (INDEPENDENT_AMBULATORY_CARE_PROVIDER_SITE_OTHER): Payer: Managed Care, Other (non HMO) | Admitting: General Practice

## 2021-06-22 VITALS — BP 135/79 | HR 111

## 2021-06-22 DIAGNOSIS — O10913 Unspecified pre-existing hypertension complicating pregnancy, third trimester: Secondary | ICD-10-CM

## 2021-06-22 DIAGNOSIS — O0990 Supervision of high risk pregnancy, unspecified, unspecified trimester: Secondary | ICD-10-CM

## 2021-06-22 NOTE — Progress Notes (Signed)
Pt informed that the ultrasound is considered a limited OB ultrasound and is not intended to be a complete ultrasound exam.  Patient also informed that the ultrasound is not being completed with the intent of assessing for fetal or placental anomalies or any pelvic abnormalities.  Explained that the purpose of today's ultrasound is to assess for  BPP, presentation, and AFI.  Patient acknowledges the purpose of the exam and the limitations of the study.     Koren Bound RN BSN 06/22/21

## 2021-06-23 ENCOUNTER — Ambulatory Visit (INDEPENDENT_AMBULATORY_CARE_PROVIDER_SITE_OTHER): Payer: Managed Care, Other (non HMO) | Admitting: Family Medicine

## 2021-06-23 ENCOUNTER — Encounter: Payer: Self-pay | Admitting: Family Medicine

## 2021-06-23 VITALS — BP 139/82 | HR 97

## 2021-06-23 DIAGNOSIS — O0990 Supervision of high risk pregnancy, unspecified, unspecified trimester: Secondary | ICD-10-CM

## 2021-06-23 DIAGNOSIS — O10919 Unspecified pre-existing hypertension complicating pregnancy, unspecified trimester: Secondary | ICD-10-CM

## 2021-06-23 DIAGNOSIS — F419 Anxiety disorder, unspecified: Secondary | ICD-10-CM

## 2021-06-23 NOTE — Patient Instructions (Addendum)
AREA PEDIATRIC/FAMILY PRACTICE PHYSICIANS  Central/[place]outheast [place] ([address]) [facility] Chambliss, MD; Eniola, MD; Hale, MD; Hensel, MD; McDiarmid, MD; McIntyer, MD; Neal, MD; Walden, MD [address] [place], [place] [address] [phone] Mon-Fri 8:30-12:30, 1:30-5:00 Providers come to see babies at [facility] Accepting Medicaid [facility] providers who accept newborns: Koirala, MD; Morrow, MD; Wolters, MD [address], [place], [place] [address] [phone] Mon-Fri 8:00-5:30 Babies seen by providers at [facility] Does NOT accept Medicaid Please call early in hospitalization for appointment (limited availability)  [facility] Mulberry, MD [address] [place], [place] [address] [phone] Mon, Tue, Thur, Fri 8:30-5:00, Wed 10:00-7:00 (closed 1-2pm) Babies seen by [facility] providers Accepting Medicaid Rubin - Pediatrician Rubin, MD [address], [place], [place] [address] [phone] Mon-Fri 8:30-5:00, Sat 8:30-12:00 Provider comes to see babies at [facility] Accepting Medicaid Must have been referred from current patients or contacted office prior to delivery [facility] ([facility]) Brown, MD; Chandler, MD; Ettefagh, MD; Grant, MD; Lester, MD; McCormick, MD; McQueen, MD; Prose, MD; Simha, MD; Stanley, MD; Stryffeler, NP; Tebben, NP [address], [place], [place] [address] [phone] Mon, Tue, Thur, Fri 8:30-5:30, Wed 9:30-5:30, Sat 8:30-12:30 Babies seen by [facility] providers Accepting Medicaid Only accepting infants of first-time parents or siblings of current patients Hospital discharge coordinator will make follow-up appointment Jack Amos [address], [place], [place]  [address] [phone]   Fax - [phone] [facility] [address], [place], [place]  [address] Phone - [phone]   Fax - [phone] Shilpa Gosrani [address], [place], [place]  [address] [phone]  [place]ortheast [place] ([address]) [facility] Bates, MD; [place]assfield, MD; [place], Cox, MD; MD; Davis, MD; Dovico, MD; Ettefaugh, MD; Little, MD; Lowe, MD; Keiffer, MD; Melvin, MD; Sumner, MD; Williams, MD [address], [place], [place] [address] [phone] Mon-Fri 8:30-5:00 (extended evenings Mon-Thur as needed), Sat-Sun 10:00-1:00 Providers come to see babies at [facility] Accepting Medicaid for families of first-time babies and families with all children in the household age 3 and under. Must register with office prior to making appointment (M-F only). [facility] Henson, NP; Knapp, MD; Lalonde, MD; Tysinger, PA [address] [place], [place] [address] [phone] Mon-Fri 8:00-5:00 Babies seen by providers at [facility] Does NOT accept Medicaid/Commercial Insurance Only [facility] ([facility])  Artis, MD; Barnes, MD; Bratton, MD; Coccaro, MD; Lockett Gardner, MD; Kramer, MD; Marshall, MD; Netherton, MD; Poleto, MD; Skinner, MD [address] [place], [place] [address] [phone] Mon-Fri 8:30-5:30, Sat (Oct.-Mar.) 9:00-1:00 Babies seen by providers at [facility] Accepting Medicaid  [place] ([address]) [facility] Reid, MD; Warner, MD [address], [place], [place] [address] [phone] Mon-Fri 8:30-5:00, Sat 8:30-12:00 Providers come to see babies at [facility] Does NOT accept Medicaid [facility] Becker, PA; Hagler, MD; Scifres, PA; Sun, MD; Swayne, MD [address], [place], [place] [address] [phone] Mon-Fri 8:00-5:00 Babies seen by providers at [facility] Does NOT accept Medicaid Only accepting babies of parents who  are patients Please call early in hospitalization for appointment (limited availability)  Pediatricians Clark, MD; Frye, MD; Kelleher, MD; Mack, NP; Miller, MD; O'Keller, MD; [place], NP; Pudlo, MD; Puzio, MD; Thomas, MD; Tucker, MD; Twiselton, MD [address], [place], [place] [address] [phone] Mon-Fri 8:00-5:00, Sat 9:00-12:00 Providers come to see babies at [facility] Does NOT accept Medicaid  [place] ([address]) [facility] providers accepting new patients: Brake, NP; Wharton, PA [address], [place], [place] [address] [phone] Mon-Fri 8:00-5:00 Babies seen by providers at [facility] Does NOT accept Medicaid Only accepting babies of parents who are patients Please call early in hospitalization for appointment (limited availability) [facility] Gay, MD; [place], MD [address] [place], [place] [address] [phone] (press 1 to schedule appointment) Mon-Fri 8:00-5:00 Providers come to see babies at [facility] Does NOT accept Medicaid [facility] Mazer, MD [address] [place], [place] [address] [phone] Mon-Fri 8:30-5:00 (lunch 12:30-1:00), extended hours by appointment only Wed 5:00-6:30 Babies seen by [facility] providers Accepting Medicaid [facility] Banks, MD; Jordan, MD; Koberlein, MD [address], [place], [place] [address] [phone] Mon-Fri 8:00-5:00 Babies seen by [facility] providers Does NOT accept Medicaid [facility] Parker, MD; Hunter, MD; Wallace, DO [address] [place], [place] [address] [phone] Mon-Fri 8:00-5:00 Babies seen by [facility] providers Does NOT accept Medicaid [facility] Brandon, PA; Brecken, PA; Christy, NP; Dees, MD; DeClaire, MD; DeWeese, MD; Hansen, NP; Mills, NP; Parrish, NP; Smoot, NP; Summer, MD; Vapne,  MD [address] [place], [place] [address] [phone] Mon-Fri 8:30-5:00, Sat 10:00-1:00 Providers come to see babies at [facility] Does NOT accept Medicaid Free prenatal information session Tuesdays at 4:45pm [facility] Bouska, MD; Gordon, PA; Jeffery, PA; Weber, PA [address] [place] [address] [phone] Mon-Fri 7:30-5:30 Babies seen by [facility] providers [facility] [address], [place], [place]  [address] [phone]   Fax - [phone]  [place] ([address] & [address]) [facility] [place] [address] [place], [place] [address] [phone] Mon-Thur 8:00-6:00 Providers come to see babies at [facility] Accepting Medicaid [facility] Anderson, NP; Badger, MD; Beal, PA; Spencer, PA [address] [place], [place] [address] [phone] Mon-Thur 7:30-7:30, Fri 7:30-4:30 Babies seen by [facility] providers Accepting Medicaid [facility] Agbuya, MD; Klett, NP; Romgoolam, MD [address], [place], [place] [address] [phone] Mon-Fri 8:30-5:00, Sat 8:30-12:00 Providers come to see babies at [facility] Accepting Medicaid Must have "Meet & Greet" appointment at office prior to delivery [facility] [place] ([facility] McCord, MD; Wallace, MD; Wood, MD [address], [place], [place] [address] [phone] Mon-Wed 8:00-6:00, Thur-Fri 8:00-5:00, Sat 9:00-12:00 Providers come to see babies at [facility] Does NOT accept Medicaid Only accepting siblings of current patients [facility] [place]  [address], [place], [place]  [address] [phone]   Fax - [phone] [facility] [address], [place], [place]  [address] [phone]   Fax -  [phone]  [place] ([address] & [address]) [facility] Cirigliano, DO; Matthews, DO [address] [place], [place] [address] [phone] Mon-Fri 7:00-5:00 Babies seen by [facility] providers Does NOT accept Medicaid [facility] Briscoe, MD; Howley, PA; Moreira, PA [address], [place], [place] [address] [phone] Mon-Fri 8:00-5:00 Babies seen by [facility] providers Accepting Medicaid [facility] Boyd, MD; Church, PA; Jones, NP; Osborn, PA [address], , [place] [address] [phone] Mon-Fri 8:00-5:00 Babies seen by providers at [facility] Accepting Medicaid  [place] ([address]) [facility] [place] Wend[place], DO [address] [place], [place] [address] [phone] Mon-Fri 8:00-5:00 Babies seen by [facility] providers Does NOT accept Medicaid Limited availability, please call early in hospitalization to schedule follow-up [facility] Calderon, PA; Cummings, MD; Dillard, MD; Martin, PA; Olson, MD; VanDeven, PA [address], [place], [place] [address] [phone] Mon-Fri 8:30-5:00, Sat 9:00-12:00 Babies seen by providers at [facility] Accepting Medicaid Please register online then schedule online or call office [email or web address]) Hunter, NP; Kumar, MD; Martin Rogers, PA [address], [place], [place] [address] [phone] Mon-Fri 8:00-5:00 Babies seen by providers at [facility] Accepting Medicaid [facility] ([facility]) Campbellton, MD; Kristi Fleenor, NP; West, MD [address], [place], [place] [address] [phone] Mon-Fri 8:00-5:30, Sat&Sun by appointment (phones open at  8:30) Babies seen by [facility] providers Accepting Medicaid Must be a first-time baby or sibling of current patient [facility] [place]  [address], [place], [place]  [address] [phone]   Fax - [phone]  [place] ([address]) [facility] Brown, PA; Cowen, PA; Rice, MD; Helton, PA; Spry, MD [address] [place], [place] [address] [phone] Mon-Thur 8:00-7:00, Fri 8:00-5:00, Sat 8:00-12:00, Sun 9:00-12:00 Babies seen by [facility] providers Accepting Medicaid [facility] Coe-Goins, MD; Marshall, MD; Pierre-Louis, MD [address], [place], [place] [address] [phone] Mon-Thur 8:00-5:00 Babies seen by providers at [facility] Accepting Medicaid [facility] Bratton, MD; Coe-Goins, MD; Hayes, MD; Lewis, MD; List, MD; Lott, MD; Marshall, MD; Moran, MD; O'Neal, MD; Pierre-Louis, MD; Pitonzo, MD; Scholer, MD; Spangle, MD [address] [place], [place] [address] [phone] Mon-Fri 8:00-5:30, Sat (Oct.-Mar.) 9:00-1:00 Babies seen by providers at [facility] Accepting Medicaid Must fill out new patient packet, available online at [email or web address] [facility] ([facility]) Friddle, NP; Harris, NP; Kelly, NP; Logan, MD; Melvin, PA; Poth, MD; Ramadoss, MD; Stanton, NP [address], [place], [place] [address] [phone] Mon-Thur 8:00-5:30, Fri 8:00-5:00 Babies seen by providers at [facility] Accepting Medicaid  [place] ([address]) [facility] Dixon, PA; [place], MD; Pickard, MD; Tapia, PA [address], [place], [place] [address] [phone] Mon-Fri 8:00-5:00 Babies seen by providers at [facility] Accepting Medicaid   [place] ([address]) [facility] [place] Masneri, DO; Meyers, MD; Nelson, PA [address], [place], [place] [address] [phone] Mon-Fri 8:00-5:00 Babies seen by providers at [facility] Does NOT accept Medicaid Limited appointment availability, please call early in hospitalization  [facility] [place] Kunedd, DO; McGowen, MD [address], [place], [place] [address] [phone] Mon-Fri 8:00-5:00 Babies seen by [facility] providers Does NOT accept Medicaid [facility] [place] Cameron, MD; MacDonald, MD; Michaels, PA; Nayak, MD [address] [place] [address], [place], [place] [address] [phone] Mon-Fri 8:00-5:00 After hours clinic ([address] [place], [place] [address]) [phone] Mon-Fri 5:00-8:00, Sat 12:00-6:00, Sun 10:00-4:00 Babies seen by [facility] providers Accepting Medicaid [facility] [place] [address] [place], [place]  [address] [phone]   Fax - [phone]  [place] [phone]) [place] [facility], [place] [address], [place], [place] [address] [phone] Mon-Fri 8:00-5:00 Babies [facility] by [facility] providers Does NOT accept Medicaid [place] [facility] [facility] [place] Daron Offer, [place] [address], [place], [place] [address] [phone] Mon-Thur 8:00-7:00, Fri 8:00-5:00, Sat 8:00-12:00 Babies seen by providers at [facility] Accepting Medicaid - but does not have vaccinations in office (must be received elsewhere) Limited availability, please call early in hospitalization  [place] [phone]) [place], MD [address], [place] [address] [phone]  Fax [phone]  [facility]  Lauretta Chester, MD, [place], [place], [place], [place], [place], [place]  [address] [facility]  [place], [place], [place] [address] [place], [place], [place] [address] [phone] ([place])  [facility] [address], [place], [place] [address] [place], [place], [place] [address] [phone] [facility] [address], [place], [place], [place] [address] [phone] [facility] [address] [place], [place] [address] [place], [place], [place] [address] [facility] [address] [place], [place] [address] [phone] [place]. [address], [place], [place] [address] [phone] Dr. Okey Regal. [place] [address], [place], [place] [address] [phone] [facility] [address], [place], [place] [address] [phone] [facility] [address], [place],  [address] [phone]  Contraception Choices Contraception, also called birth control, refers to methods or devices that prevent pregnancy. Hormonal methods Contraceptive implant A contraceptive implant is a thin, plastic tube that contains a hormone that prevents pregnancy. It is different from an intrauterine device (IUD). It is inserted into the upper part of the arm by a health care provider. Implants can be effective for up to 3 years. Progestin-only injections Progestin-only injections are injections of progestin, a synthetic form of the hormone progesterone. They are given every 3 months by a health care provider. Birth control pills Birth control pills are pills that contain hormones that prevent pregnancy. They must be taken once a day, preferably at the same time each day. A prescription is needed to use this method of contraception. Birth control patch The birth control patch contains hormones that prevent pregnancy.  It is placed on the skin and must be changed once a week for three weeks and removed on the fourth week. A prescription is needed to use this method of contraception. Vaginal ring A vaginal ring contains hormones that prevent pregnancy. It is placed in the vagina for three weeks and removed on the fourth week. After that, the process is repeated with a new ring. A prescription is needed to use this method of contraception. Emergency contraceptive Emergency contraceptives prevent pregnancy after unprotected sex. They come in pill form and can be taken up to 5 days after sex. They work best the sooner they are taken after having sex. Most emergency contraceptives are available without a prescription. This method should not be used as your only form of birth control. Barrier methods Female condom A female condom is a thin sheath that is worn over the penis during sex. Condoms keep sperm from going inside a woman's body. They can be used with a sperm-killing substance (spermicide) to  increase their effectiveness. They should be thrown away after one use. Female condom A female condom is a soft, loose-fitting sheath that is put into the vagina before sex. The condom keeps sperm from going inside a woman's body. They should be thrown away after one use. Diaphragm A diaphragm is a soft, dome-shaped barrier. It is inserted into the vagina before sex, along with a spermicide. The diaphragm blocks sperm from entering the uterus, and the spermicide kills sperm. A diaphragm should be left in the vagina for 6-8 hours after sex and removed within 24 hours. A diaphragm is prescribed and fitted by a health care provider. A diaphragm should be replaced every 1-2 years, after giving birth, after gaining more than 15 lb (6.8 kg), and after pelvic surgery. Cervical cap A cervical cap is a round, soft latex or plastic cup that fits over the cervix. It is inserted into the vagina before sex, along with spermicide. It blocks  sperm from entering the uterus. The cap should be left in place for 6-8 hours after sex and removed within 48 hours. A cervical cap must be prescribed and fitted by a health care provider. It should be replaced every 2 years. Sponge A sponge is a soft, circular piece of polyurethane foam with spermicide in it. The sponge helps block sperm from entering the uterus, and the spermicide kills sperm. To use it, you make it wet and then insert it into the vagina. It should be inserted before sex, left in for at least 6 hours after sex, and removed and thrown away within 30 hours. Spermicides Spermicides are chemicals that kill or block sperm from entering the cervix and uterus. They can come as a cream, jelly, suppository, foam, or tablet. A spermicide should be inserted into the vagina with an applicator at least 01-74 minutes before sex to allow time for it to work. The process must be repeated every time you have sex. Spermicides do not require a prescription. Intrauterine contraception Intrauterine device (IUD) An IUD is a T-shaped device that is put in a woman's uterus. There are two types: Hormone IUD.This type contains progestin, a synthetic form of the hormone progesterone. This type can stay in place for 3-5 years. Copper IUD.This type is wrapped in copper wire. It can stay in place for 10 years. Permanent methods of contraception Female tubal ligation In this method, a woman's fallopian tubes are sealed, tied, or blocked during surgery to prevent eggs from traveling to the uterus. Hysteroscopic sterilization In this method, a small, flexible insert is placed into each fallopian tube. The inserts cause scar tissue to form in the fallopian tubes and block them, so sperm cannot reach an egg. The procedure takes about 3 months to be effective. Another form of birth control must be used during those 3 months. Female sterilization This is a procedure to tie off the tubes that carry sperm (vasectomy).  After the procedure, the man can still ejaculate fluid (semen). Another form of birth control must be used for 3 months after the procedure. Natural planning methods Natural family planning In this method, a couple does not have sex on days when the woman could become pregnant. Calendar method In this method, the woman keeps track of the length of each menstrual cycle, identifies the days when pregnancy can happen, and does not have sex on those days. Ovulation method In this method, a couple avoids sex during ovulation. Symptothermal method This method involves not having sex during ovulation. The woman typically  checks for ovulation by watching changes in her temperature and in the consistency of cervical mucus. Post-ovulation method In this method, a couple waits to have sex until after ovulation. Where to find more information Centers for Disease Control and Prevention: http://www.wolf.info/ Summary Contraception, also called birth control, refers to methods or devices that prevent pregnancy. Hormonal methods of contraception include implants, injections, pills, patches, vaginal rings, and emergency contraceptives. Barrier methods of contraception can include female condoms, female condoms, diaphragms, cervical caps, sponges, and spermicides. There are two types of IUDs (intrauterine devices). An IUD can be put in a woman's uterus to prevent pregnancy for 3-5 years. Permanent sterilization can be done through a procedure for males and females. Natural family planning methods involve nothaving sex on days when the woman could become pregnant. This information is not intended to replace advice given to you by your health care provider. Make sure you discuss any questions you have with your health care provider. Document Revised: 01/14/2020 Document Reviewed: 01/14/2020 Elsevier Patient Education  [place].

## 2021-06-23 NOTE — Progress Notes (Signed)
Subjective:  Kathleen Joyce is a 35 y.o. G1P0000 at [redacted]w[redacted]d being seen today for ongoing prenatal care.  She is currently monitored for the following issues for this high-risk pregnancy and has Morbid obesity with BMI of 40.0-44.9, adult ([place]); Class 3 severe obesity with serious comorbidity and body mass index (BMI) of 40.0 to 44.9 in adult [facility]); Depression; Other hyperlipidemia; Iron deficiency anemia; Vitamin D deficiency; Anxiety disorder; Threatened miscarriage in early pregnancy; [redacted] weeks gestation of pregnancy; Obesity complicating pregnancy in third trimester; Supervision of high risk pregnancy, antepartum; and Chronic hypertension in obstetric context in third trimester on their problem list.  Patient reports  having a very difficult time with multiple stressful events occurring .  Contractions: Irritability. Vag. Bleeding: None.  Movement: Present. Denies leaking of fluid.   The following portions of the patient's history were reviewed and updated as appropriate: allergies, current medications, past family history, past medical history, past social history, past surgical history and problem list. Problem list updated.  Objective:   Vitals:   06/23/21 1552  BP: 139/82  Pulse: 97    Fetal Status: Fetal Heart Rate (bpm): 138   Movement: Present     General:  Alert, oriented and cooperative. Patient is in no acute distress.  Skin: Skin is warm and dry. No rash noted.   Cardiovascular: Normal heart rate noted  Respiratory: Normal respiratory effort, no problems with respiration noted  Abdomen: Soft, gravid, appropriate for gestational age. Pain/Pressure: Present     Pelvic: Vag. Bleeding: None     Cervical exam deferred        Extremities: Normal range of motion.  Edema: Trace  Mental Status: Normal mood and affect. Normal behavior. Normal judgment and thought content.   Urinalysis:      Assessment and Plan:  Pregnancy: G1P0000 at [redacted]w[redacted]d  1. Supervision of high risk pregnancy,  antepartum BP and FHR normal  2. Chronic hypertension in obstetric context in third trimester Extensive chart review done as I was unclear how she was given this diagnosis After significant review appears she had elevated BP's during an MAU visit for possible miscarriage in April She then moved to [place] and reports she had a home monitor with values as high as 160s/90s As far as I can tell in our system and the records we received we only have one episode of elevated BP but does not strictly meet criteria based on what I can see However given her report of elevated home BP and fact that outside OB assigned this diagnosis I told her I can't remove it She dislikes the diagnosis as she feels it is not accurate and her BP's were situational We discussed this is unlikely and she still carries increased risk in this pregnancy for complications which should be communicated through the chart Agreed to change diagnosis in chart per her request to "Hypertension in pregnancy, essential" Also discussed we have some discretion in our IOL guidelines as she does not want to be induced at 39 weeks Given excellent BP's to date, discussed we can go to maximum of allowed MFM guidelines which is [redacted]w[redacted]d, she is amenable to this plan Orders placed after visit, complete forms at next visit  3. Anxiety disorder, unspecified type Understandably having a hard time Her mother is possibly going to transition to hospice and her father died recently She is also very worried about having to deal with her mothers death, a new baby, and all the moving pieces that involves Validated that  we are not often asked to confront the extremes of life, birth and death, at the same time She is seeing [name] Discussed we will follow her closely for PP depression and that we can start a medication at anytime she wishes, defers for time being  Preterm labor symptoms and general obstetric precautions including but not limited to vaginal  bleeding, contractions, leaking of fluid and fetal movement were reviewed in detail with the patient. Please refer to After Visit Summary for other counseling recommendations.  Return in 1 week (on 06/30/2021) for [facility], ob visit.   Clarnce Flock, MD

## 2021-06-23 NOTE — Progress Notes (Signed)
Met with mom and dad while in the office for OB appointment at [facility] request.   Mom asking how best to get her milk supply in once infant is born. Reviewed skin to skin, what to expect with feeding in the early days, milk coming to Volume, and colostrum. Mom has a hands free pump, reviewed this is not the best for establishing milk supply but may work well after milk is established.   Reviewed IP Lactation services and OP Services.   Mom will reach out for any further questions or concerns as needed.

## 2021-06-25 ENCOUNTER — Ambulatory Visit: Payer: Managed Care, Other (non HMO) | Admitting: Clinical

## 2021-06-25 DIAGNOSIS — F4323 Adjustment disorder with mixed anxiety and depressed mood: Secondary | ICD-10-CM

## 2021-06-25 NOTE — BH Specialist Note (Signed)
Integrated Behavioral Health via Telemedicine Visit  06/25/2021 [name] [id number]  Number of [place] visits: 5 Session Start time: 9:56  Session End time: 10:53 Total time:  57  Referring Provider: Lynnda Shields, MD Patient/Family location: [facility] Provider location: Center for [employer] at [facility]  All persons participating in visit: [name] and [place]   Types of Service: Individual psychotherapy and Telephone visit  I connected with Kathleen Joyce and/or Kathleen Joyce's  n/a  via  Telephone or Geologist, engineering  (Video is Tree surgeon) and verified that I am speaking with the correct person using two identifiers. Discussed confidentiality: Yes   I discussed the limitations of telemedicine and the availability of in person appointments.  Discussed there is a possibility of technology failure and discussed alternative modes of communication if that failure occurs.  I discussed that engaging in this telemedicine visit, they consent to the provision of behavioral healthcare and the services will be billed under their insurance.  Patient and/or legal guardian expressed understanding and consented to Telemedicine visit: Yes   Presenting Concerns: Patient and/or family reports the following symptoms/concerns: Pt's mother passed on Friday, 07-05-2021; lack of empathy from work Librarian, academic.  Duration of problem: Less than one week  Patient and/or Family's Strengths/Protective Factors: Social connections, Social and Emotional competence, Concrete supports in place (healthy food, safe environments, etc.), Sense of purpose, and Physical Health (exercise, healthy diet, medication compliance, etc.)  Goals Addressed:  Demonstrate ability to: Begin healthy grieving over loss  Progress towards Goals: Ongoing  Interventions: Interventions utilized:  Supportive  Reflection Standardized Assessments completed: Not Needed  Patient and/or Family Response: Pt agrees with treatment plan  Assessment: Patient currently experiencing Grief.   Patient may benefit from brief therapeutic interventions today.  Plan: Follow up with behavioral health clinician on : One week Behavioral recommendations:  -Continue allowing feelings of grief -Continue with plan to begin work leave after 07/06/21 -Continue with plan to make final arrangements for funeral on 19th (transfer of ownership, manage movers, pick out scriptures, pick out clothing for mother and self)  Referral(s): [place] (In Clinic)  I discussed the assessment and treatment plan with the patient and/or parent/guardian. They were provided an opportunity to ask questions and all were answered. They agreed with the plan and demonstrated an understanding of the instructions.   They were advised to call back or seek an in-person evaluation if the symptoms worsen or if the condition fails to improve as anticipated.  [name], LCSW  Depression screen [facility] 2/9 06/29/2021 06/23/2021 06/15/2021 06/02/2021 12/19/2019  Decreased Interest 1 1 2 1 2   Down, Depressed, Hopeless 1 1 2 1 1   PHQ - 2 Score 2 2 4 2 3   Altered sleeping 1 1 3 1 3   Tired, decreased energy 1 1 1 2 2   Change in appetite 1 1 1 3 1   Feeling bad or failure about yourself  0 1 0 0 3  Trouble concentrating 0 1 0 0 3  Moving slowly or fidgety/restless 1 0 0 2 0  Suicidal thoughts 0 0 0 1 0  PHQ-9 Score 6 7 9 11 15   Difficult doing work/chores - - - - Somewhat difficult   GAD 7 : Generalized Anxiety Score 06/29/2021 06/23/2021 06/15/2021 06/02/2021  Nervous, Anxious, on Edge 0 1 2 3   Control/stop worrying 0 1 0 2  Worry too much - different  things 0 1 2 2   Trouble relaxing 0 1 3 3   Restless 0 1 1 2   Easily annoyed or irritable 0 1 1 0  Afraid - awful might happen 0 1 3 3   Total GAD 7 Score 0 7 12 15    Anxiety Difficulty - - - -

## 2021-06-29 ENCOUNTER — Ambulatory Visit (INDEPENDENT_AMBULATORY_CARE_PROVIDER_SITE_OTHER): Payer: Self-pay | Admitting: Obstetrics and Gynecology

## 2021-06-29 ENCOUNTER — Encounter: Payer: Self-pay | Admitting: Obstetrics and Gynecology

## 2021-06-29 ENCOUNTER — Other Ambulatory Visit: Payer: Self-pay

## 2021-06-29 ENCOUNTER — Other Ambulatory Visit (HOSPITAL_COMMUNITY)
Admission: RE | Admit: 2021-06-29 | Discharge: 2021-06-29 | Disposition: A | Discharge status: Home / Self Care | Payer: Managed Care, Other (non HMO) | Source: Ambulatory Visit | Attending: Obstetrics and Gynecology | Admitting: Obstetrics and Gynecology

## 2021-06-29 ENCOUNTER — Ambulatory Visit: Payer: Managed Care, Other (non HMO) | Admitting: *Deleted

## 2021-06-29 ENCOUNTER — Ambulatory Visit (INDEPENDENT_AMBULATORY_CARE_PROVIDER_SITE_OTHER): Payer: Self-pay

## 2021-06-29 VITALS — BP 106/75 | HR 108 | Wt 317.0 lb

## 2021-06-29 DIAGNOSIS — O99213 Obesity complicating pregnancy, third trimester: Secondary | ICD-10-CM

## 2021-06-29 DIAGNOSIS — O0990 Supervision of high risk pregnancy, unspecified, unspecified trimester: Secondary | ICD-10-CM

## 2021-06-29 DIAGNOSIS — O10913 Unspecified pre-existing hypertension complicating pregnancy, third trimester: Secondary | ICD-10-CM

## 2021-06-29 DIAGNOSIS — O10919 Unspecified pre-existing hypertension complicating pregnancy, unspecified trimester: Secondary | ICD-10-CM

## 2021-06-29 NOTE — Progress Notes (Addendum)
PRENATAL VISIT NOTE  Subjective:  Kathleen Joyce is a 35 y.o. G1P0000 at [redacted]w[redacted]d being seen today for ongoing prenatal care.  She is currently monitored for the following issues for this high-risk pregnancy and has Morbid obesity with BMI of 40.0-44.9, adult ([place]); Class 3 severe obesity with serious comorbidity and body mass index (BMI) of 40.0 to 44.9 in adult [facility]); Depression; Other hyperlipidemia; Iron deficiency anemia; Vitamin D deficiency; Anxiety disorder; Obesity complicating pregnancy in third trimester; Supervision of high risk pregnancy, antepartum; and Hypertension in pregnancy, essential on their problem list.  Patient reports no complaints.  Contractions: Irritability. Vag. Bleeding: None.  Movement: Present. Denies leaking of fluid.   The following portions of the patient's history were reviewed and updated as appropriate: allergies, current medications, past family history, past medical history, past social history, past surgical history and problem list.   Objective:   Vitals:   06/29/21 0926  BP: 106/75  Pulse: (!) 108  Weight: (!) 317 lb (143.8 kg)    Fetal Status: Fetal Heart Rate (bpm): 148 Fundal Height: 36 cm Movement: Present  Presentation: Vertex  General:  Alert, oriented and cooperative. Patient is in no acute distress.  Skin: Skin is warm and dry. No rash noted.   Cardiovascular: Normal heart rate noted  Respiratory: Normal respiratory effort, no problems with respiration noted  Abdomen: Soft, gravid, appropriate for gestational age.  Pain/Pressure: Present     Pelvic: Cervical exam performed in the presence of a chaperone Dilation: 3 Effacement (%): 60 Station: -2  Extremities: Normal range of motion.  Edema: Trace  Mental Status: Normal mood and affect. Normal behavior. Normal judgment and thought content.   Assessment and Plan:  Pregnancy: G1P0000 at [redacted]w[redacted]d 1. Obesity complicating pregnancy in third trimester - Normal growth is 28%ile on 10/26 with  growth being symmetric, position is cephalic  2. Hypertension in pregnancy, essential - Reviewed all records and notes available as well as Dr. Allen Derry note. Per documentation in [place], in May the patient had some elevated Bps in the office with a repeat of 140s/80s as well as elevated Bps in the MAU. At this time she is scheduled for IOL on 11/21 ([place] is 11/28 by 8w [place] in MAU). She was briefly started on labetalol and then was taken off it.  - Blood pressures have all otherwise been normal at her appts in [place] and here.  - She will keep IOL at this time for 11/21 but notes mother's funeral is on 11/19. We discussed she could have IOL later if she desired. She will consider and discuss with her spouse.   3. Supervision of high risk pregnancy, antepartum - Reviewed records from [place] and prior notes. No 28w labs sent but pt reported having these labs done. No early GTT noted in the chart. We will call them today to establish if she has had the testing and then we can request to obtain those results.  - Otherwise, GBS testing done today  Preterm labor symptoms and general obstetric precautions including but not limited to vaginal bleeding, contractions, leaking of fluid and fetal movement were reviewed in detail with the patient. Please refer to After Visit Summary for other counseling recommendations.   Return in about 1 week (around 07/06/2021) for OB VISIT, MD or APP.  Future Appointments  Date Time Provider [place]  07/02/2021  9:45 AM [facility]  07/03/2021  4:20 PM Tobb, Kardie, DO [facility] None  07/06/2021  9:15 AM  [facility]  07/13/2021  9:15 AM [facility]  07/15/2021  6:30 AM [facility] SCHED ROOM [facility] None    Radene Gunning, MD

## 2021-06-30 LAB — GC/CHLAMYDIA PROBE AMP (~~LOC~~) NOT AT ARMC
Chlamydia: NEGATIVE
Comment: NEGATIVE
Comment: NORMAL
Neisseria Gonorrhea: NEGATIVE

## 2021-07-02 ENCOUNTER — Ambulatory Visit: Payer: 59 | Admitting: Clinical

## 2021-07-02 DIAGNOSIS — F4321 Adjustment disorder with depressed mood: Secondary | ICD-10-CM

## 2021-07-03 ENCOUNTER — Encounter: Payer: Self-pay | Admitting: Cardiology

## 2021-07-03 ENCOUNTER — Other Ambulatory Visit: Payer: Self-pay

## 2021-07-03 ENCOUNTER — Ambulatory Visit (INDEPENDENT_AMBULATORY_CARE_PROVIDER_SITE_OTHER): Payer: Managed Care, Other (non HMO) | Admitting: Cardiology

## 2021-07-03 VITALS — BP 129/77 | HR 96 | Ht 70.0 in

## 2021-07-03 DIAGNOSIS — O10919 Unspecified pre-existing hypertension complicating pregnancy, unspecified trimester: Secondary | ICD-10-CM

## 2021-07-03 HISTORY — DX: Morbid (severe) obesity due to excess calories: E66.01

## 2021-07-03 LAB — CULTURE, BETA STREP (GROUP B ONLY): Strep Gp B Culture: NEGATIVE

## 2021-07-03 NOTE — Patient Instructions (Addendum)
Medication Instructions:  Your physician recommends that you continue on your current medications as directed. Please refer to the Current Medication list given to you today.  *If you need a refill on your cardiac medications before your next appointment, please call your pharmacy*   Lab Work: None If you have labs (blood work) drawn today and your tests are completely normal, you will receive your results only by: [place] (if you have MyChart) OR A paper copy in the mail If you have any lab test that is abnormal or we need to change your treatment, we will call you to review the results.   Testing/Procedures: None   Follow-Up: At [facility], you and your health needs are our priority.  As part of our continuing mission to provide you with exceptional heart care, we have created designated Provider Care Teams.  These Care Teams include your primary Cardiologist (physician) and Advanced Practice Providers (APPs -  Physician Assistants and Nurse Practitioners) who all work together to provide you with the care you need, when you need it.  We recommend signing up for the patient portal called "MyChart".  Sign up information is provided on this After Visit Summary.  MyChart is used to connect with patients for Virtual Visits (Telemedicine).  Patients are able to view lab/test results, encounter notes, upcoming appointments, etc.  Non-urgent messages can be sent to your provider as well.   To learn more about what you can do with MyChart, go to [email or web address].    Your next appointment:    8 weeks  The format for your next appointment:   In Person  Provider:   Loistine Simas [facility] [address] [place], [place] [address]    Other Instructions  [place] Diet A Mediterranean diet refers to food and lifestyle choices that are based on the traditions of countries located on the [employer]. It focuses on eating more fruits, vegetables, whole  grains, beans, nuts, seeds, and heart-healthy fats, and eating less dairy, meat, eggs, and processed foods with added sugar, salt, and fat. This way of eating has been shown to help prevent certain conditions and improve outcomes for people who have chronic diseases, like kidney disease and heart disease. What are tips for following this plan? Reading food labels Check the serving size of packaged foods. For foods such as rice and pasta, the serving size refers to the amount of cooked product, not dry. Check the total fat in packaged foods. Avoid foods that have saturated fat or trans fats. Check the ingredient list for added sugars, such as corn syrup. Shopping  Buy a variety of foods that offer a balanced diet, including: Fresh fruits and vegetables (produce). Grains, beans, nuts, and seeds. Some of these may be available in unpackaged forms or large amounts (in bulk). Fresh seafood. Poultry and eggs. Low-fat dairy products. Buy whole ingredients instead of prepackaged foods. Buy fresh fruits and vegetables in-season from local farmers markets. Buy plain frozen fruits and vegetables. If you do not have access to quality fresh seafood, buy precooked frozen shrimp or canned fish, such as tuna, salmon, or sardines. Stock your pantry so you always have certain foods on hand, such as olive oil, canned tuna, canned tomatoes, rice, pasta, and beans. Cooking Cook foods with extra-virgin olive oil instead of using butter or other vegetable oils. Have meat as a side dish, and have vegetables or grains as your main dish. This means having meat in small portions or adding small amounts  of meat to foods like pasta or stew. Use beans or vegetables instead of meat in common dishes like chili or lasagna. Experiment with different cooking methods. Try roasting, broiling, steaming, and sauting vegetables. Add frozen vegetables to soups, stews, pasta, or rice. Add nuts or seeds for added healthy fats and  plant protein at each meal. You can add these to yogurt, salads, or vegetable dishes. Marinate fish or vegetables using olive oil, lemon juice, garlic, and fresh herbs. Meal planning Plan to eat one vegetarian meal one day each week. Try to work up to two vegetarian meals, if possible. Eat seafood two or more times a week. Have healthy snacks readily available, such as: Vegetable sticks with hummus. Greek yogurt. Fruit and nut trail mix. Eat balanced meals throughout the week. This includes: Fruit: 2-3 servings a day. Vegetables: 4-5 servings a day. Low-fat dairy: 2 servings a day. Fish, poultry, or lean meat: 1 serving a day. Beans and legumes: 2 or more servings a week. Nuts and seeds: 1-2 servings a day. Whole grains: 6-8 servings a day. Extra-virgin olive oil: 3-4 servings a day. Limit red meat and sweets to only a few servings a month. Lifestyle  Cook and eat meals together with your family, when possible. Drink enough fluid to keep your urine pale yellow. Be physically active every day. This includes: Aerobic exercise like running or swimming. Leisure activities like gardening, walking, or housework. Get 7-8 hours of sleep each night. If recommended by your health care provider, drink red wine in moderation. This means 1 glass a day for nonpregnant women and 2 glasses a day for men. A glass of wine equals 5 oz (150 mL). What foods should I eat? Fruits Apples. Apricots. Avocado. Berries. Bananas. Cherries. Dates. Figs. Grapes. Lemons. Melon. Oranges. Peaches. Plums. Pomegranate. Vegetables Artichokes. Beets. Broccoli. Cabbage. Carrots. Eggplant. Green beans. Chard. Kale. Spinach. Onions. Leeks. Peas. Squash. Tomatoes. Peppers. Radishes. Grains Whole-grain pasta. Brown rice. Bulgur wheat. Polenta. Couscous. Whole-wheat bread. [place]. Meats and other proteins Beans. Almonds. Sunflower seeds. Pine nuts. Peanuts. [place]. Salmon. Scallops. Shrimp. [place]. Tilapia. Clams.  Oysters. Eggs. Poultry without skin. Dairy Low-fat milk. Cheese. Greek yogurt. Fats and oils Extra-virgin olive oil. Avocado oil. Grapeseed oil. Beverages Water. Red wine. Herbal tea. Sweets and desserts Greek yogurt with honey. Baked apples. Poached pears. Trail mix. Seasonings and condiments Basil. Cilantro. Coriander. Cumin. Mint. Parsley. Sage. Rosemary. Tarragon. Garlic. Oregano. Thyme. Pepper. Balsamic vinegar. Tahini. Hummus. Tomato sauce. Olives. Mushrooms. The items listed above may not be a complete list of foods and beverages you can eat. Contact a dietitian for more information. What foods should I limit? This is a list of foods that should be eaten rarely or only on special occasions. Fruits Fruit canned in syrup. Vegetables Deep-fried potatoes (french fries). Grains Prepackaged pasta or rice dishes. Prepackaged cereal with added sugar. Prepackaged snacks with added sugar. Meats and other proteins Beef. Pork. Lamb. Poultry with skin. Hot dogs. Berniece Salines. Dairy Ice cream. Sour cream. Whole milk. Fats and oils Butter. Canola oil. Vegetable oil. Beef fat (tallow). Lard. Beverages Juice. Sugar-sweetened soft drinks. Beer. Liquor and spirits. Sweets and desserts Cookies. Cakes. Pies. Candy. Seasonings and condiments Mayonnaise. Pre-made sauces and marinades. The items listed above may not be a complete list of foods and beverages you should limit. Contact a dietitian for more information. Summary The Mediterranean diet includes both food and lifestyle choices. Eat a variety of fresh fruits and vegetables, beans, nuts, seeds, and whole grains. Limit the amount  of red meat and sweets that you eat. If recommended by your health care provider, drink red wine in moderation. This means 1 glass a day for nonpregnant women and 2 glasses a day for men. A glass of wine equals 5 oz (150 mL). This information is not intended to replace advice given to you by your health care provider.  Make sure you discuss any questions you have with your health care provider. Document Revised: 09/14/2019 Document Reviewed: 07/12/2019 Elsevier Patient Education  2022 [employer].

## 2021-07-03 NOTE — BH Specialist Note (Signed)
error 

## 2021-07-03 NOTE — Progress Notes (Signed)
Cardio-Obstetrics Clinic  New Evaluation  Date:  07/03/2021   ID:  Kathleen, Joyce Feb 08, 1986, MRN [id number]  PCP:  [place], Betty G, MD   [facility] Providers Cardiologist:  Berniece Salines, DO  Electrophysiologist:  None       Referring MD: Griffin Basil, MD   Chief Complaint: " I am doing fine from my heart"  History of Present Illness:    Kathleen Joyce is a 35 y.o. female [G1P0000] who is being seen today for the evaluation of  at the request of Griffin Basil, MD.   She has a history of hypertension in pregnancy, hyperlipidemia, anxiety disorder who is here today in the cardiac surgery clinic to be evaluated.  The patient tells me that she was restarted on labetalol after the initial blood pressure spike and then labetalol dropped her blood pressures over so that she was experiencing intermittent syncope episode.  Since the labetalol has been stopped she has not experienced this.  She takes her blood pressure every day at home and noticed that it is within normal range and has not been over 130 for a while.  She denies any chest pain or shortness of breath.  She tells me that she has had some intermittent contractions but she is monitoring.  He denies any bloody discharge or any excessive mucus.  She has not been able to talk to her OB/GYN about this issue.  Prior CV Studies Reviewed: The following studies were reviewed today:   Past Medical History:  Diagnosis Date   Anemia    Anxiety disorder 12/19/2019   Chronic hypertension in obstetric context in third trimester 05/27/2021   Depression 07/12/2019   Left knee pain    Other hyperlipidemia 07/12/2019   Plantar fasciitis    Right hip pain     Past Surgical History:  Procedure Laterality Date   NO PAST SURGERIES        OB History     Gravida  1   Para  0   Term  0   Preterm  0   AB  0   Living  0      SAB  0   IAB  0   Ectopic  0   Multiple  0   Live Births  0                Current Medications: Current Meds  Medication Sig   aspirin EC 81 MG tablet Take 81 mg by mouth daily. Swallow whole.   famotidine (PEPCID) 40 MG tablet Take 40 mg by mouth 2 (two) times daily.   Prenatal Vit-Fe Fumarate-FA (PRENATAL VITAMIN) 27-0.8 MG TABS Take 1 tablet by mouth daily.     Allergies:   Banana   Social History   Socioeconomic History   Marital status: Married    Spouse name: Not on file   Number of children: 0   Years of education: Not on file   Highest education level: Not on file  Occupational History   Not on file  Tobacco Use   Smoking status: Former   Smokeless tobacco: Never  Vaping Use   Vaping Use: Never used  Substance and Sexual Activity   Alcohol use: Not Currently    Comment: rare   Drug use: Not Currently   Sexual activity: Yes    Partners: Male    Birth control/protection: None    Comment: 1st intercourse- 37, partners- 17, current partner- 25 yrs  Other Topics  Concern   Not on file  Social History Narrative   Not on file   Social Determinants of Health   Financial Resource Strain: Not on file  Food Insecurity: No Food Insecurity   Worried About Running Out of Food in the Last Year: Never true   Ran Out of Food in the Last Year: Never true  Transportation Needs: No Transportation Needs   Lack of Transportation (Medical): No   Lack of Transportation (Non-Medical): No  Physical Activity: Not on file  Stress: Not on file  Social Connections: Not on file      Family History  Problem Relation Age of Onset   Lupus Mother    Kidney disease Mother    Depression Mother    Heart attack Mother    Diverticulitis Father    Depression Father    Diabetes Maternal Aunt    Breast cancer Maternal Aunt 67   Cancer Paternal Grandfather        colon      ROS:   Please see the history of present illness.     All other systems reviewed and are negative.   Labs/EKG Reviewed:    EKG:   EKG is was ordered today.  The ekg ordered  today demonstrates sinus rhythm, heart rate 96 bpm.  Recent Labs: 12/13/2020: BUN 6; Creatinine, Ser 0.71; Hemoglobin 11.6; Platelets 191; Potassium 3.7; Sodium 135   Recent Lipid Panel Lab Results  Component Value Date/Time   CHOL 159 07/11/2019 09:35 AM   TRIG 78 07/11/2019 09:35 AM   HDL 46 07/11/2019 09:35 AM   CHOLHDL 4 05/12/2018 08:47 AM   LDLCALC 98 07/11/2019 09:35 AM    Physical Exam:    VS:  BP 129/77 (BP Location: Right Arm)   Pulse 96   Ht 5\' 10"  (1.778 m)   LMP 10/06/2020   SpO2 100%   BMI 45.48 kg/m     Wt Readings from Last 3 Encounters:  06/29/21 (!) 317 lb (143.8 kg)  06/15/21 (!) 322 lb 1.6 oz (146.1 kg)  06/08/21 (!) 320 lb 6.4 oz (145.3 kg)     GEN:  Well nourished, well developed in no acute distress HEENT: Normal NECK: No JVD; No carotid bruits LYMPHATICS: No lymphadenopathy CARDIAC: RRR, no murmurs, rubs, gallops RESPIRATORY:  Clear to auscultation without rales, wheezing or rhonchi  ABDOMEN: Soft, non-tender, non-distended MUSCULOSKELETAL:  No edema; No deformity  SKIN: Warm and dry NEUROLOGIC:  Alert and oriented x 3 PSYCHIATRIC:  Normal affect    Risk Assessment/Risk Calculators:     CARPREG II Risk Prediction Index Score:  1.  The patient's risk for a primary cardiac event is 5%.   Modified World Health Organization [facility]) Classification of Maternal CV Risk   Class I         ASSESSMENT & PLAN:    Hypertension in pregnancy Morbid obesity Anxiety disorder  Her blood pressure in the office today is normal.  Educated the patient on how to continue to take her blood pressure at home and did explain to her that if she experienced anything greater than 130/80 to notify my office.  She has had some bad reaction with the labetalol which made her hypotensive.  The patient notes that she feels that her Hiep pretension was situational as she was going through stress during the time.  I explained to the patient that despite her elevated  blood pressure at the time being situational she is at high risk for developing hypertension in  pregnancy as well as postpartum hypertension.  She was grateful for the explanation and education.  I discussed with the patient that she may need to limit her weight gain greater than 20 pounds during this pregnancy.   Patient Instructions  Medication Instructions:  Your physician recommends that you continue on your current medications as directed. Please refer to the Current Medication list given to you today.  *If you need a refill on your cardiac medications before your next appointment, please call your pharmacy*   Lab Work: None If you have labs (blood work) drawn today and your tests are completely normal, you will receive your results only by: [place] (if you have MyChart) OR A paper copy in the mail If you have any lab test that is abnormal or we need to change your treatment, we will call you to review the results.   Testing/Procedures: None   Follow-Up: At [facility], you and your health needs are our priority.  As part of our continuing mission to provide you with exceptional heart care, we have created designated Provider Care Teams.  These Care Teams include your primary Cardiologist (physician) and Advanced Practice Providers (APPs -  Physician Assistants and Nurse Practitioners) who all work together to provide you with the care you need, when you need it.  We recommend signing up for the patient portal called "MyChart".  Sign up information is provided on this After Visit Summary.  MyChart is used to connect with patients for Virtual Visits (Telemedicine).  Patients are able to view lab/test results, encounter notes, upcoming appointments, etc.  Non-urgent messages can be sent to your provider as well.   To learn more about what you can do with MyChart, go to [email or web address].    Your next appointment:    8 weeks  The format for your next appointment:    In Person  Provider:   Loistine Simas [facility] [address] [place], [place] [address]    Other Instructions  [place] Diet A Mediterranean diet refers to food and lifestyle choices that are based on the traditions of countries located on the [employer]. It focuses on eating more fruits, vegetables, whole grains, beans, nuts, seeds, and heart-healthy fats, and eating less dairy, meat, eggs, and processed foods with added sugar, salt, and fat. This way of eating has been shown to help prevent certain conditions and improve outcomes for people who have chronic diseases, like kidney disease and heart disease. What are tips for following this plan? Reading food labels Check the serving size of packaged foods. For foods such as rice and pasta, the serving size refers to the amount of cooked product, not dry. Check the total fat in packaged foods. Avoid foods that have saturated fat or trans fats. Check the ingredient list for added sugars, such as corn syrup. Shopping  Buy a variety of foods that offer a balanced diet, including: Fresh fruits and vegetables (produce). Grains, beans, nuts, and seeds. Some of these may be available in unpackaged forms or large amounts (in bulk). Fresh seafood. Poultry and eggs. Low-fat dairy products. Buy whole ingredients instead of prepackaged foods. Buy fresh fruits and vegetables in-season from local farmers markets. Buy plain frozen fruits and vegetables. If you do not have access to quality fresh seafood, buy precooked frozen shrimp or canned fish, such as tuna, salmon, or sardines. Stock your pantry so you always have certain foods on hand, such as olive oil, canned tuna, canned tomatoes, rice, pasta, and  beans. Cooking Cook foods with extra-virgin olive oil instead of using butter or other vegetable oils. Have meat as a side dish, and have vegetables or grains as your main dish. This means having meat in small portions or  adding small amounts of meat to foods like pasta or stew. Use beans or vegetables instead of meat in common dishes like chili or lasagna. Experiment with different cooking methods. Try roasting, broiling, steaming, and sauting vegetables. Add frozen vegetables to soups, stews, pasta, or rice. Add nuts or seeds for added healthy fats and plant protein at each meal. You can add these to yogurt, salads, or vegetable dishes. Marinate fish or vegetables using olive oil, lemon juice, garlic, and fresh herbs. Meal planning Plan to eat one vegetarian meal one day each week. Try to work up to two vegetarian meals, if possible. Eat seafood two or more times a week. Have healthy snacks readily available, such as: Vegetable sticks with hummus. Greek yogurt. Fruit and nut trail mix. Eat balanced meals throughout the week. This includes: Fruit: 2-3 servings a day. Vegetables: 4-5 servings a day. Low-fat dairy: 2 servings a day. Fish, poultry, or lean meat: 1 serving a day. Beans and legumes: 2 or more servings a week. Nuts and seeds: 1-2 servings a day. Whole grains: 6-8 servings a day. Extra-virgin olive oil: 3-4 servings a day. Limit red meat and sweets to only a few servings a month. Lifestyle  Cook and eat meals together with your family, when possible. Drink enough fluid to keep your urine pale yellow. Be physically active every day. This includes: Aerobic exercise like running or swimming. Leisure activities like gardening, walking, or housework. Get 7-8 hours of sleep each night. If recommended by your health care provider, drink red wine in moderation. This means 1 glass a day for nonpregnant women and 2 glasses a day for men. A glass of wine equals 5 oz (150 mL). What foods should I eat? Fruits Apples. Apricots. Avocado. Berries. Bananas. Cherries. Dates. Figs. Grapes. Lemons. Melon. Oranges. Peaches. Plums. Pomegranate. Vegetables Artichokes. Beets. Broccoli. Cabbage. Carrots.  Eggplant. Green beans. Chard. Kale. Spinach. Onions. Leeks. Peas. Squash. Tomatoes. Peppers. Radishes. Grains Whole-grain pasta. Brown rice. Bulgur wheat. Polenta. Couscous. Whole-wheat bread. [place]. Meats and other proteins Beans. Almonds. Sunflower seeds. Pine nuts. Peanuts. [place]. Salmon. Scallops. Shrimp. [place]. Tilapia. Clams. Oysters. Eggs. Poultry without skin. Dairy Low-fat milk. Cheese. Greek yogurt. Fats and oils Extra-virgin olive oil. Avocado oil. Grapeseed oil. Beverages Water. Red wine. Herbal tea. Sweets and desserts Greek yogurt with honey. Baked apples. Poached pears. Trail mix. Seasonings and condiments Basil. Cilantro. Coriander. Cumin. Mint. Parsley. Sage. Rosemary. Tarragon. Garlic. Oregano. Thyme. Pepper. Balsamic vinegar. Tahini. Hummus. Tomato sauce. Olives. Mushrooms. The items listed above may not be a complete list of foods and beverages you can eat. Contact a dietitian for more information. What foods should I limit? This is a list of foods that should be eaten rarely or only on special occasions. Fruits Fruit canned in syrup. Vegetables Deep-fried potatoes (french fries). Grains Prepackaged pasta or rice dishes. Prepackaged cereal with added sugar. Prepackaged snacks with added sugar. Meats and other proteins Beef. Pork. Lamb. Poultry with skin. Hot dogs. Berniece Salines. Dairy Ice cream. Sour cream. Whole milk. Fats and oils Butter. Canola oil. Vegetable oil. Beef fat (tallow). Lard. Beverages Juice. Sugar-sweetened soft drinks. Beer. Liquor and spirits. Sweets and desserts Cookies. Cakes. Pies. Candy. Seasonings and condiments Mayonnaise. Pre-made sauces and marinades. The items listed above may not be a  complete list of foods and beverages you should limit. Contact a dietitian for more information. Summary The Mediterranean diet includes both food and lifestyle choices. Eat a variety of fresh fruits and vegetables, beans, nuts, seeds, and whole  grains. Limit the amount of red meat and sweets that you eat. If recommended by your health care provider, drink red wine in moderation. This means 1 glass a day for nonpregnant women and 2 glasses a day for men. A glass of wine equals 5 oz (150 mL). This information is not intended to replace advice given to you by your health care provider. Make sure you discuss any questions you have with your health care provider. Document Revised: 09/14/2019 Document Reviewed: 07/12/2019 Elsevier Patient Education  2022 [place]:  No follow-ups on file.   Medication Adjustments/Labs and Tests Ordered: Current medicines are reviewed at length with the patient today.  Concerns regarding medicines are outlined above.  Tests Ordered: Orders Placed This Encounter  Procedures   EKG 12-Lead   Medication Changes: No orders of the defined types were placed in this encounter.

## 2021-07-06 ENCOUNTER — Ambulatory Visit (INDEPENDENT_AMBULATORY_CARE_PROVIDER_SITE_OTHER): Payer: Managed Care, Other (non HMO) | Admitting: Family Medicine

## 2021-07-06 ENCOUNTER — Ambulatory Visit (INDEPENDENT_AMBULATORY_CARE_PROVIDER_SITE_OTHER): Payer: Managed Care, Other (non HMO)

## 2021-07-06 ENCOUNTER — Ambulatory Visit: Payer: Managed Care, Other (non HMO) | Admitting: *Deleted

## 2021-07-06 ENCOUNTER — Other Ambulatory Visit: Payer: Self-pay

## 2021-07-06 VITALS — BP 121/80 | HR 101 | Wt 320.9 lb

## 2021-07-06 DIAGNOSIS — O10913 Unspecified pre-existing hypertension complicating pregnancy, third trimester: Secondary | ICD-10-CM

## 2021-07-06 DIAGNOSIS — O99213 Obesity complicating pregnancy, third trimester: Secondary | ICD-10-CM

## 2021-07-06 DIAGNOSIS — D508 Other iron deficiency anemias: Secondary | ICD-10-CM

## 2021-07-06 DIAGNOSIS — O10919 Unspecified pre-existing hypertension complicating pregnancy, unspecified trimester: Secondary | ICD-10-CM

## 2021-07-06 DIAGNOSIS — O0990 Supervision of high risk pregnancy, unspecified, unspecified trimester: Secondary | ICD-10-CM

## 2021-07-06 NOTE — Progress Notes (Signed)
   PRENATAL VISIT NOTE  Subjective:  Kathleen Joyce is a 35 y.o. G1P0000 at [redacted]w[redacted]d being seen today for ongoing prenatal care.  She is currently monitored for the following issues for this high-risk pregnancy and has Morbid obesity with BMI of 40.0-44.9, adult ([place]); Class 3 severe obesity with serious comorbidity and body mass index (BMI) of 40.0 to 44.9 in adult [facility]); Depression; Other hyperlipidemia; Iron deficiency anemia; Vitamin D deficiency; Anxiety disorder; Obesity complicating pregnancy in third trimester; Supervision of high risk pregnancy, antepartum; Hypertension in pregnancy, essential; and Morbid obesity (Kathleen Joyce) on their problem list.  Patient reports no complaints.  Contractions: Irregular. Vag. Bleeding: None.  Movement: Present. Denies leaking of fluid.   The following portions of the patient's history were reviewed and updated as appropriate: allergies, current medications, past family history, past medical history, past social history, past surgical history and problem list.   Objective:   Vitals:   07/06/21 1040  BP: 121/80  Pulse: (!) 101  Weight: (!) 320 lb 14.4 oz (145.6 kg)    Fetal Status: Fetal Heart Rate (bpm): RNST   Movement: Present     General:  Alert, oriented and cooperative. Patient is in no acute distress.  Skin: Skin is warm and dry. No rash noted.   Cardiovascular: Normal heart rate noted  Respiratory: Normal respiratory effort, no problems with respiration noted  Abdomen: Soft, gravid, appropriate for gestational age.  Pain/Pressure: Present     Pelvic: Cervical exam performed in the presence of a chaperone        Extremities: Normal range of motion.     Mental Status: Normal mood and affect. Normal behavior. Normal judgment and thought content.   Assessment and Plan:  Pregnancy: G1P0000 at [redacted]w[redacted]d 1. Obesity complicating pregnancy in third trimester TWG= 21 lb 14.4 oz (9.934 kg) which is above goal but not overly excessive. Pregraivd weight based  on [redacted]w[redacted]d weight in hospital.   2. Supervision of high risk pregnancy, antepartum Up to date  3. Other iron deficiency anemia Lab Results  Component Value Date   HGB 11.6 (L) 12/13/2020   HGB 13.3 12/13/2020   HGB 13.4 12/19/2019   4. Hypertension in pregnancy, essential IOL scheduled for 11/23 BP is WNL today  Term labor symptoms and general obstetric precautions including but not limited to vaginal bleeding, contractions, leaking of fluid and fetal movement were reviewed in detail with the patient. Please refer to After Visit Summary for other counseling recommendations.   Return in about 1 week (around 07/13/2021) for [facility], NST/BPP as scheduled.  Future Appointments  Date Time Provider [place]  07/06/2021 11:15 AM Caren Macadam, MD [facility]  07/09/2021  9:45 AM [facility]  07/13/2021  9:15 AM [facility]  07/13/2021 10:15 AM Aletha Halim, MD [facility]  07/15/2021  6:30 AM [facility]-LD SCHED ROOM [facility] None  08/27/2021 11:00 AM Tobb, Godfrey Pick, DO [facility]    Caren Macadam, MD

## 2021-07-07 ENCOUNTER — Other Ambulatory Visit: Payer: Self-pay | Admitting: Advanced Practice Midwife

## 2021-07-09 ENCOUNTER — Ambulatory Visit (INDEPENDENT_AMBULATORY_CARE_PROVIDER_SITE_OTHER): Payer: Managed Care, Other (non HMO) | Admitting: Clinical

## 2021-07-09 DIAGNOSIS — F4321 Adjustment disorder with depressed mood: Secondary | ICD-10-CM | POA: Diagnosis not present

## 2021-07-09 NOTE — BH Specialist Note (Signed)
Integrated Behavioral Health via Telemedicine Visit  07/09/2021 [name] [id number]  Number of [place] visits: 6 Session Start time: 9:48  Session End time: 10:17 Total time:  29  Referring Provider: Lynnda Shields, MD Patient/Family location: [place], [place] [facility] Provider location: [facility] [place] [facility]  All persons participating in visit: [name] and [place]   Types of Service: Individual psychotherapy and Telephone visit  I connected with [name] and/or [name]'s  n/a  via  Telephone or Geologist, engineering  (Video is Tree surgeon) and verified that I am speaking with the correct person using two identifiers. Discussed confidentiality: Yes   I discussed the limitations of telemedicine and the availability of in person appointments.  Discussed there is a possibility of technology failure and discussed alternative modes of communication if that failure occurs.  I discussed that engaging in this telemedicine visit, they consent to the provision of behavioral healthcare and the services will be billed under their insurance.  Patient and/or legal guardian expressed understanding and consented to Telemedicine visit: Yes   Presenting Concerns: Patient and/or family reports the following symptoms/concerns: Processing feelings of grief after loss of mother as well as preparing for baby's arrival Duration of problem: Ongoing; Severity of problem: moderate  Patient and/or Family's Strengths/Protective Factors: Social connections, Social and Emotional competence, Concrete supports in place (healthy food, safe environments, etc.), Sense of purpose, and Physical Health (exercise, healthy diet, medication compliance, etc.)  Goals Addressed: Patient will:  Reduce symptoms of:  continue healthy grieving     Progress towards  Goals: Ongoing  Interventions: Interventions utilized:  Supportive Reflection Standardized Assessments completed:  ohq9/gad7 give in past two weeks  Patient and/or Family Response: Pt agrees with treatment plan  Assessment: Patient currently experiencing Grief.   Patient may benefit from continued brief therapeutic interventions regsrding current grief.  Plan: Follow up with behavioral health clinician on : Call [name] at [phone], as needed; [name] will call postpartum Behavioral recommendations:  -Continue taking prenatal vitamin daily -Continue plans for preparing for funeral on 07/11/21 and upcoming birth (scheduled 07/15/21, but possibly earlier) Referral(s): [place] (In Clinic)  I discussed the assessment and treatment plan with the patient and/or parent/guardian. They were provided an opportunity to ask questions and all were answered. They agreed with the plan and demonstrated an understanding of the instructions.   They were advised to call back or seek an in-person evaluation if the symptoms worsen or if the condition fails to improve as anticipated.  [name], LCSW  Depression screen [facility] 2/9 07/06/2021 06/29/2021 06/23/2021 06/15/2021 06/02/2021  Decreased Interest 0 1 1 2 1   Down, Depressed, Hopeless 0 1 1 2 1   PHQ - 2 Score 0 2 2 4 2   Altered sleeping 0 1 1 3 1   Tired, decreased energy 0 1 1 1 2   Change in appetite 0 1 1 1 3   Feeling bad or failure about yourself  0 0 1 0 0  Trouble concentrating 0 0 1 0 0  Moving slowly or fidgety/restless 0 1 0 0 2  Suicidal thoughts 0 0 0 0 1  PHQ-9 Score 0 6 7 9 11   Difficult doing work/chores - - - - -   GAD 7 : Generalized Anxiety Score 07/06/2021 06/29/2021 06/23/2021 06/15/2021  Nervous, Anxious, on Edge 0 0 1 2  Control/stop worrying 0 0 1 0  Worry too much -  different things 0 0 1 2  Trouble relaxing 0 0 1 3  Restless 0 0 1 1  Easily annoyed or irritable 0 0 1 1  Afraid - awful  might happen 0 0 1 3  Total GAD 7 Score 0 0 7 12  Anxiety Difficulty - - - -

## 2021-07-13 ENCOUNTER — Other Ambulatory Visit: Payer: Self-pay

## 2021-07-13 ENCOUNTER — Ambulatory Visit: Payer: Managed Care, Other (non HMO) | Admitting: *Deleted

## 2021-07-13 ENCOUNTER — Encounter: Payer: Self-pay | Admitting: Obstetrics and Gynecology

## 2021-07-13 ENCOUNTER — Ambulatory Visit (INDEPENDENT_AMBULATORY_CARE_PROVIDER_SITE_OTHER): Payer: Managed Care, Other (non HMO) | Admitting: Obstetrics and Gynecology

## 2021-07-13 ENCOUNTER — Ambulatory Visit (INDEPENDENT_AMBULATORY_CARE_PROVIDER_SITE_OTHER): Payer: Managed Care, Other (non HMO)

## 2021-07-13 VITALS — BP 135/77 | HR 102 | Wt 323.0 lb

## 2021-07-13 DIAGNOSIS — O99213 Obesity complicating pregnancy, third trimester: Secondary | ICD-10-CM

## 2021-07-13 DIAGNOSIS — F419 Anxiety disorder, unspecified: Secondary | ICD-10-CM

## 2021-07-13 DIAGNOSIS — D259 Leiomyoma of uterus, unspecified: Secondary | ICD-10-CM

## 2021-07-13 DIAGNOSIS — O10913 Unspecified pre-existing hypertension complicating pregnancy, third trimester: Secondary | ICD-10-CM | POA: Diagnosis not present

## 2021-07-13 DIAGNOSIS — O10919 Unspecified pre-existing hypertension complicating pregnancy, unspecified trimester: Secondary | ICD-10-CM | POA: Diagnosis not present

## 2021-07-13 DIAGNOSIS — O99210 Obesity complicating pregnancy, unspecified trimester: Secondary | ICD-10-CM

## 2021-07-13 DIAGNOSIS — O0990 Supervision of high risk pregnancy, unspecified, unspecified trimester: Secondary | ICD-10-CM

## 2021-07-13 DIAGNOSIS — Z6841 Body Mass Index (BMI) 40.0 and over, adult: Secondary | ICD-10-CM

## 2021-07-13 DIAGNOSIS — O3410 Maternal care for benign tumor of corpus uteri, unspecified trimester: Secondary | ICD-10-CM

## 2021-07-13 DIAGNOSIS — Z3A38 38 weeks gestation of pregnancy: Secondary | ICD-10-CM

## 2021-07-13 HISTORY — DX: Leiomyoma of uterus, unspecified: O34.10

## 2021-07-13 HISTORY — DX: Leiomyoma of uterus, unspecified: D25.9

## 2021-07-13 NOTE — Progress Notes (Signed)
   PRENATAL VISIT NOTE  Subjective:  Kathleen Joyce is a 35 y.o. G1P0000 at [redacted]w[redacted]d being seen today for ongoing prenatal care.  She is currently monitored for the following issues for this high-risk pregnancy and has BMI 45.0-49.9, adult ([name]); Obesity in pregnancy; Depression; Other hyperlipidemia; Iron deficiency anemia; Vitamin D deficiency; Anxiety disorder; Supervision of high risk pregnancy, antepartum; Chronic hypertension during pregnancy, antepartum; and Uterine fibroid during pregnancy, antepartum on their problem list.  Patient reports no complaints.  Contractions: Irregular. Vag. Bleeding: None.  Movement: Present. Denies leaking of fluid.   The following portions of the patient's history were reviewed and updated as appropriate: allergies, current medications, past family history, past medical history, past social history, past surgical history and problem list.   Objective:   Vitals:   07/13/21 1026  BP: 135/77  Pulse: (!) 102  Weight: (!) 323 lb (146.5 kg)    Fetal Status: Fetal Heart Rate (bpm): RNST   Movement: Present     General:  Alert, oriented and cooperative. Patient is in no acute distress.  Skin: Skin is warm and dry. No rash noted.   Cardiovascular: Normal heart rate noted  Respiratory: Normal respiratory effort, no problems with respiration noted  Abdomen: Soft, gravid, appropriate for gestational age.  Pain/Pressure: Present     Pelvic: Cervical exam deferred        Extremities: Normal range of motion.     Mental Status: Normal mood and affect. Normal behavior. Normal judgment and thought content.   Assessment and Plan:  Pregnancy: G1P0000 at [redacted]w[redacted]d 1. Supervision of high risk pregnancy, antepartum Set up for morning IOL on Wednesday. Process d/w pt GBS neg  2. Obesity complicating pregnancy in third trimester  3. Chronic hypertension in obstetric context in third trimester Doing well on no meds Bpp 16/10, cephalic today [id number], 09%, ac 33% on  10/26  4. Chronic hypertension during pregnancy, antepartum  5. Uterine fibroid during pregnancy, antepartum 6cm anterior at last u/s  6. BMI 45.0-49.9, adult ([name])  7. Obesity in pregnancy  8. Anxiety disorder, unspecified type Doing well on no meds; followed by [name]  9. [redacted] weeks gestation of pregnancy  Term labor symptoms and general obstetric precautions including but not limited to vaginal bleeding, contractions, leaking of fluid and fetal movement were reviewed in detail with the patient. Please refer to After Visit Summary for other counseling recommendations.   Return for IOL on 11/23.  Future Appointments  Date Time Provider [place]  07/15/2021  6:30 AM [facility] [place] [facility] None  08/27/2021 11:00 AM Tobb, Godfrey Pick, DO [facility]    Aletha Halim, MD

## 2021-07-13 NOTE — Progress Notes (Signed)
IOL scheduled on 11/23

## 2021-07-15 ENCOUNTER — Inpatient Hospital Stay (HOSPITAL_COMMUNITY): Payer: Managed Care, Other (non HMO)

## 2021-07-16 ENCOUNTER — Inpatient Hospital Stay (HOSPITAL_COMMUNITY)
Admission: AD | Admit: 2021-07-16 | Discharge: 2021-07-19 | DRG: 807 | Disposition: A | Discharge status: Home / Self Care | Payer: Managed Care, Other (non HMO) | Attending: Obstetrics and Gynecology | Admitting: Obstetrics and Gynecology

## 2021-07-16 ENCOUNTER — Encounter (HOSPITAL_COMMUNITY): Payer: Self-pay | Admitting: Family Medicine

## 2021-07-16 ENCOUNTER — Other Ambulatory Visit: Payer: Self-pay

## 2021-07-16 DIAGNOSIS — Z3A39 39 weeks gestation of pregnancy: Secondary | ICD-10-CM | POA: Diagnosis not present

## 2021-07-16 DIAGNOSIS — Z87891 Personal history of nicotine dependence: Secondary | ICD-10-CM

## 2021-07-16 DIAGNOSIS — O99214 Obesity complicating childbirth: Secondary | ICD-10-CM | POA: Diagnosis present

## 2021-07-16 DIAGNOSIS — Z20822 Contact with and (suspected) exposure to covid-19: Secondary | ICD-10-CM | POA: Diagnosis present

## 2021-07-16 DIAGNOSIS — O10919 Unspecified pre-existing hypertension complicating pregnancy, unspecified trimester: Secondary | ICD-10-CM | POA: Diagnosis present

## 2021-07-16 DIAGNOSIS — O1002 Pre-existing essential hypertension complicating childbirth: Principal | ICD-10-CM | POA: Diagnosis present

## 2021-07-16 DIAGNOSIS — Z23 Encounter for immunization: Secondary | ICD-10-CM

## 2021-07-16 DIAGNOSIS — O1092 Unspecified pre-existing hypertension complicating childbirth: Secondary | ICD-10-CM | POA: Diagnosis not present

## 2021-07-16 DIAGNOSIS — Z7982 Long term (current) use of aspirin: Secondary | ICD-10-CM | POA: Diagnosis not present

## 2021-07-16 HISTORY — DX: Leiomyoma of uterus, unspecified: D25.9

## 2021-07-16 LAB — CBC
HCT: 38.5 % (ref 36.0–46.0)
Hemoglobin: 12.5 g/dL (ref 12.0–15.0)
MCH: 28.3 pg (ref 26.0–34.0)
MCHC: 32.5 g/dL (ref 30.0–36.0)
MCV: 87.1 fL (ref 80.0–100.0)
Platelets: 157 10*3/uL (ref 150–400)
RBC: 4.42 MIL/uL (ref 3.87–5.11)
RDW: 14 % (ref 11.5–15.5)
WBC: 8.4 10*3/uL (ref 4.0–10.5)
nRBC: 0 % (ref 0.0–0.2)

## 2021-07-16 LAB — PROTEIN / CREATININE RATIO, URINE
Creatinine, Urine: 62.88 mg/dL
Total Protein, Urine: <6 mg/dL

## 2021-07-16 LAB — COMPREHENSIVE METABOLIC PANEL
ALT: 18 U/L (ref 0–44)
AST: 20 U/L (ref 15–41)
Albumin: 2.5 g/dL — ABNORMAL LOW (ref 3.5–5.0)
Alkaline Phosphatase: 125 U/L (ref 38–126)
Anion gap: 9 (ref 5–15)
BUN: 6 mg/dL (ref 6–20)
CO2: 22 mmol/L (ref 22–32)
Calcium: 9.2 mg/dL (ref 8.9–10.3)
Chloride: 104 mmol/L (ref 98–111)
Creatinine, Ser: 0.7 mg/dL (ref 0.44–1.00)
GFR, Estimated: >60 mL/min (ref >60)
Glucose, Bld: 93 mg/dL (ref 70–99)
Potassium: 4.1 mmol/L (ref 3.5–5.1)
Sodium: 135 mmol/L (ref 135–145)
Total Bilirubin: 0.5 mg/dL (ref 0.3–1.2)
Total Protein: 6.8 g/dL (ref 6.5–8.1)

## 2021-07-16 LAB — RESP PANEL BY RT-PCR (FLU A&B, COVID) ARPGX2
Influenza A by PCR: NEGATIVE
Influenza B by PCR: NEGATIVE
SARS Coronavirus 2 by RT PCR: NEGATIVE

## 2021-07-16 LAB — TYPE AND SCREEN
ABO/RH(D): A POS
Antibody Screen: NEGATIVE

## 2021-07-16 LAB — HEPATITIS B SURFACE ANTIGEN: Hepatitis B Surface Ag: NONREACTIVE

## 2021-07-16 MED ORDER — FENTANYL CITRATE (PF) 100 MCG/2ML IJ SOLN
50.0000 ug | INTRAMUSCULAR | Status: DC | PRN
  Administered 2021-07-17 (×2): 100 ug via INTRAVENOUS
  Administered 2021-07-17: 50 ug via INTRAVENOUS
  Filled 2021-07-16 (×4): qty 2

## 2021-07-16 MED ORDER — SOD CITRATE-CITRIC ACID 500-334 MG/5ML PO SOLN: 30.0000 mL | ORAL | Status: DC | PRN

## 2021-07-16 MED ORDER — MISOPROSTOL 50MCG HALF TABLET: 50.0000 ug | ORAL_TABLET | ORAL | Status: DC | PRN

## 2021-07-16 MED ORDER — ACETAMINOPHEN 325 MG PO TABS: 650.0000 mg | ORAL_TABLET | ORAL | Status: DC | PRN

## 2021-07-16 MED ORDER — OXYTOCIN-SODIUM CHLORIDE 30-0.9 UT/500ML-% IV SOLN
1.0000 m[IU]/min | INTRAVENOUS | Status: DC
  Administered 2021-07-16 – 2021-07-17 (×2): 2 m[IU]/min via INTRAVENOUS
  Filled 2021-07-16: qty 500

## 2021-07-16 MED ORDER — LIDOCAINE HCL (PF) 1 % IJ SOLN
30.0000 mL | INTRAMUSCULAR | Status: AC | PRN
  Administered 2021-07-17: 30 mL via SUBCUTANEOUS
  Filled 2021-07-16: qty 30

## 2021-07-16 MED ORDER — ONDANSETRON HCL 4 MG/2ML IJ SOLN: 4.0000 mg | Freq: Four times a day (QID) | INTRAMUSCULAR | Status: DC | PRN

## 2021-07-16 MED ORDER — LACTATED RINGERS IV SOLN
INTRAVENOUS | Status: DC
  Administered 2021-07-17: 975 mL via INTRAVENOUS

## 2021-07-16 MED ORDER — TERBUTALINE SULFATE 1 MG/ML IJ SOLN: 0.2500 mg | Freq: Once | INTRAMUSCULAR | Status: DC | PRN

## 2021-07-16 MED ORDER — OXYTOCIN-SODIUM CHLORIDE 30-0.9 UT/500ML-% IV SOLN
2.5000 [IU]/h | INTRAVENOUS | Status: DC
  Administered 2021-07-17: 2.5 [IU]/h via INTRAVENOUS

## 2021-07-16 MED ORDER — LACTATED RINGERS IV SOLN: 500.0000 mL | INTRAVENOUS | Status: DC | PRN

## 2021-07-16 MED ORDER — OXYTOCIN BOLUS FROM INFUSION
333.0000 mL | Freq: Once | INTRAVENOUS | Status: AC
  Administered 2021-07-17: 333 mL via INTRAVENOUS

## 2021-07-16 NOTE — H&P (Signed)
Kathleen Joyce is a 35 y.o. female G1P0000 with IUP at [redacted]w[redacted]d presenting for IOL for CHTN, no meds. [facility] at [facility] since 38 wks, transfer from [place]  Prenatal History/Complications:     Past Medical History: Past Medical History:  Diagnosis Date   Anemia    Anxiety disorder 12/19/2019   Chronic hypertension in obstetric context in third trimester 05/27/2021   Depression 07/12/2019   Left knee pain    Morbid obesity ([place]) 07/03/2021   Other hyperlipidemia 07/12/2019   Plantar fasciitis    Right hip pain     Past Surgical History: Past Surgical History:  Procedure Laterality Date   NO PAST SURGERIES      Obstetrical History: OB History     Gravida  1   Para  0   Term  0   Preterm  0   AB  0   Living  0      SAB  0   IAB  0   Ectopic  0   Multiple  0   Live Births  0           Social History: Social History   Socioeconomic History   Marital status: Married    Spouse name: Not on file   Number of children: 0   Years of education: Not on file   Highest education level: Not on file  Occupational History   Not on file  Tobacco Use   Smoking status: Former   Smokeless tobacco: Never  Vaping Use   Vaping Use: Never used  Substance and Sexual Activity   Alcohol use: Not Currently    Comment: rare   Drug use: Not Currently   Sexual activity: Yes    Partners: Male    Birth control/protection: None    Comment: 1st intercourse- 20, partners- 17, current partner- 60 yrs  Other Topics Concern   Not on file  Social History Narrative   Not on file   Social Determinants of Health   Financial Resource Strain: Not on file  Food Insecurity: No Food Insecurity   Worried About Charity fundraiser in the Last Year: Never true   Arboriculturist in the Last Year: Never true  Transportation Needs: No Transportation Needs   Lack of Transportation (Medical): No   Lack of Transportation (Non-Medical): No  Physical Activity: Not on file  Stress: Not on file   Social Connections: Not on file    Family History: Family History  Problem Relation Age of Onset   Lupus Mother    Kidney disease Mother    Depression Mother    Heart attack Mother    Diverticulitis Father    Depression Father    Diabetes Maternal Aunt    Breast cancer Maternal Aunt 67   Cancer Paternal Grandfather        colon    Allergies: Allergies  Allergen Reactions   Banana     Gi upset    Medications Prior to Admission  Medication Sig Dispense Refill Last Dose   aspirin EC 81 MG tablet Take 81 mg by mouth daily. Swallow whole.      famotidine (PEPCID) 40 MG tablet Take 40 mg by mouth 2 (two) times daily.      Prenatal Vit-Fe Fumarate-FA (PRENATAL VITAMIN) 27-0.8 MG TABS Take 1 tablet by mouth daily. 30 tablet 6         Review of Systems   Constitutional: Negative for fever and chills Eyes: Negative  for visual disturbances Respiratory: Negative for shortness of breath, dyspnea Cardiovascular: Negative for chest pain or palpitations  Gastrointestinal: Negative for abdominal pain, vomiting, diarrhea and constipation.   Genitourinary: Negative for dysuria and urgency Musculoskeletal: Negative for back pain, joint pain, myalgias  Neurological: Negative for dizziness and headaches      Blood pressure (!) 142/91, pulse 97, temperature 98.8 F (37.1 C), temperature source Oral, resp. rate 16, height 5\' 10"  (1.778 m), weight (!) 147 kg, last menstrual period 10/06/2020. General appearance: alert, cooperative, and no distress Lungs: normal respiratory effort Heart: regular rate and rhythm Abdomen: soft, non-tender; bowel sounds normal Extremities: Homans sign is negative, no sign of DVT DTR's 2+ Presentation: cephalic Fetal monitoring  Baseline: 145 bpm, Variability: Good {> 6 bpm), Accelerations: Reactive, and Decelerations: Absent Uterine activity  None  Dilation: 3.5 Station: -2 Exam by:: Mylo Red Dishmon CNM   Prenatal labs: ABO, Rh:  --/--/PENDING (11/24 1416) Antibody: PENDING (11/24 1416) Rubella: immune RPR: Nonreactive (04/26 0000)  HBsAg:   AntiBODY is +, no antigen drawn prenatally. Ordered now HIV: Non-reactive (04/26 0000)  GBS: Negative/-- (11/07 1030)   Nursing Staff Provider  Office Location  [facility] Dating  Early [place]  Language  English  Anatomy US  normal  Flu Vaccine   Genetic/Carrier Screen  NIPS:   Normal Materni21 AFP:    [place]:  TDaP Vaccine   10/24 Hgb A1C or  GTT Early  Third trimester   COVID Vaccine    LAB RESULTS   Rhogam  n/a Blood Type --/--/A POS (04/23 2012)   Baby Feeding Plan Breast  Antibody NEG (04/23 2012)  Contraception  Rubella Immune (04/26 0000)  Circumcision Girl RPR Nonreactive (04/26 0000)   Pediatrician   HBsAg   NOT ON CHART (HbSAB is +)  Support Person  HCVAb neg  Prenatal Classes  HIV Non-reactive (04/26 0000)     BTL Consent  GBS   (For PCN allergy, check sensitivities)   VBAC Consent  Pap Normal 11/2020       DME Rx [ ]  BP cuff [ ]  Weight Scale Waterbirth  [ ]  Class [ ]  Consent [ ]  CNM visit  PHQ9 & GAD7 [  ] new OB [  ] 28 weeks  [  ] 36 weeks Induction  [ ]  Orders Entered [ ] Foley Y/N   Prenatal Transfer Tool  Maternal Diabetes: No Genetic Screening: Normal Maternal Ultrasounds/Referrals: Normal Fetal Ultrasounds or other Referrals:  None Maternal Substance Abuse:  No Significant Maternal Medications:  None Significant Maternal Lab Results: Group B Strep negative    Results for orders placed or performed during the hospital encounter of 07/16/21 (from the past 24 hour(s))  CBC   Collection Time: 07/16/21  2:13 PM  Result Value Ref Range   WBC 8.4 4.0 - 10.5 K/uL   RBC 4.42 3.87 - 5.11 MIL/uL   Hemoglobin 12.5 12.0 - 15.0 g/dL   HCT 38.5 36.0 - 46.0 %   MCV 87.1 80.0 - 100.0 fL   MCH 28.3 26.0 - 34.0 pg   MCHC 32.5 30.0 - 36.0 g/dL   RDW 14.0 11.5 - 15.5 %   Platelets 157 150 - 400 K/uL   nRBC 0.0 0.0 - 0.2 %  Comprehensive metabolic panel    Collection Time: 07/16/21  2:13 PM  Result Value Ref Range   Sodium 135 135 - 145 mmol/L   Potassium 4.1 3.5 - 5.1 mmol/L   Chloride 104 98 - 111 mmol/L  CO2 22 22 - 32 mmol/L   Glucose, Bld 93 70 - 99 mg/dL   BUN 6 6 - 20 mg/dL   Creatinine, Ser 0.70 0.44 - 1.00 mg/dL   Calcium 9.2 8.9 - 10.3 mg/dL   Total Protein 6.8 6.5 - 8.1 g/dL   Albumin 2.5 (L) 3.5 - 5.0 g/dL   AST 20 15 - 41 U/L   ALT 18 0 - 44 U/L   Alkaline Phosphatase 125 38 - 126 U/L   Total Bilirubin 0.5 0.3 - 1.2 mg/dL   GFR, Estimated >60 >60 mL/min   Anion gap 9 5 - 15  Type and screen   Collection Time: 07/16/21  2:16 PM  Result Value Ref Range   ABO/RH(D) PENDING    Antibody Screen PENDING    Sample Expiration      07/19/2021,2359 Performed at [facility], [place] [address], [place], [place] [address]     Assessment: Kathleen Joyce is a 35 y.o. G1P0000 with an IUP at [redacted]w[redacted]d presenting for IOL for CHTN, no meds.  Plan: #Labor: pitocin #Pain:  Per request #FWB Cat 1  Christin Fudge 07/16/2021, 3:11 PM

## 2021-07-17 ENCOUNTER — Inpatient Hospital Stay (HOSPITAL_COMMUNITY): Payer: Managed Care, Other (non HMO) | Admitting: Anesthesiology

## 2021-07-17 ENCOUNTER — Encounter (HOSPITAL_COMMUNITY): Payer: Self-pay | Admitting: Family Medicine

## 2021-07-17 DIAGNOSIS — Z3A39 39 weeks gestation of pregnancy: Secondary | ICD-10-CM

## 2021-07-17 DIAGNOSIS — O1092 Unspecified pre-existing hypertension complicating childbirth: Secondary | ICD-10-CM

## 2021-07-17 LAB — CBC WITH DIFFERENTIAL/PLATELET
Abs Immature Granulocytes: 0.04 10*3/uL (ref 0.00–0.07)
Basophils Absolute: 0 10*3/uL (ref 0.0–0.1)
Basophils Relative: 0 %
Eosinophils Absolute: 0 10*3/uL (ref 0.0–0.5)
Eosinophils Relative: 0 %
HCT: 38 % (ref 36.0–46.0)
Hemoglobin: 12.2 g/dL (ref 12.0–15.0)
Immature Granulocytes: 1 %
Lymphocytes Relative: 16 %
Lymphs Abs: 1.4 10*3/uL (ref 0.7–4.0)
MCH: 28 pg (ref 26.0–34.0)
MCHC: 32.1 g/dL (ref 30.0–36.0)
MCV: 87.4 fL (ref 80.0–100.0)
Monocytes Absolute: 0.5 10*3/uL (ref 0.1–1.0)
Monocytes Relative: 6 %
Neutro Abs: 6.7 10*3/uL (ref 1.7–7.7)
Neutrophils Relative %: 77 %
Platelets: 135 10*3/uL — ABNORMAL LOW (ref 150–400)
RBC: 4.35 MIL/uL (ref 3.87–5.11)
RDW: 14.2 % (ref 11.5–15.5)
WBC: 8.7 10*3/uL (ref 4.0–10.5)
nRBC: 0 % (ref 0.0–0.2)

## 2021-07-17 LAB — RPR: RPR Ser Ql: NONREACTIVE

## 2021-07-17 MED ORDER — INFLUENZA VAC SPLIT QUAD 0.5 ML IM SUSY
0.5000 mL | PREFILLED_SYRINGE | INTRAMUSCULAR | Status: AC
  Administered 2021-07-18: 0.5 mL via INTRAMUSCULAR
  Filled 2021-07-17: qty 0.5

## 2021-07-17 MED ORDER — FENTANYL-BUPIVACAINE-NACL 0.5-0.125-0.9 MG/250ML-% EP SOLN
12.0000 mL/h | EPIDURAL | Status: DC | PRN
  Administered 2021-07-17: 12 mL/h via EPIDURAL

## 2021-07-17 MED ORDER — MEDROXYPROGESTERONE ACETATE 150 MG/ML IM SUSP: 150.0000 mg | INTRAMUSCULAR | Status: DC | PRN

## 2021-07-17 MED ORDER — LACTATED RINGERS AMNIOINFUSION
INTRAVENOUS | Status: DC
  Administered 2021-07-17: 1000 mL via INTRAUTERINE

## 2021-07-17 MED ORDER — TERBUTALINE SULFATE 1 MG/ML IJ SOLN: 0.2500 mg | Freq: Once | INTRAMUSCULAR | Status: DC | PRN

## 2021-07-17 MED ORDER — SENNOSIDES-DOCUSATE SODIUM 8.6-50 MG PO TABS
2.0000 | ORAL_TABLET | Freq: Every day | ORAL | Status: DC
  Administered 2021-07-18 – 2021-07-19 (×2): 2 via ORAL
  Filled 2021-07-17 (×2): qty 2

## 2021-07-17 MED ORDER — WITCH HAZEL-GLYCERIN EX PADS: 1.0000 "application " | MEDICATED_PAD | CUTANEOUS | Status: DC | PRN

## 2021-07-17 MED ORDER — ONDANSETRON HCL 4 MG/2ML IJ SOLN: 4.0000 mg | INTRAMUSCULAR | Status: DC | PRN

## 2021-07-17 MED ORDER — EPHEDRINE 5 MG/ML INJ: 10.0000 mg | INTRAVENOUS | Status: DC | PRN

## 2021-07-17 MED ORDER — IBUPROFEN 600 MG PO TABS
600.0000 mg | ORAL_TABLET | Freq: Four times a day (QID) | ORAL | Status: DC
  Administered 2021-07-17 – 2021-07-19 (×8): 600 mg via ORAL
  Filled 2021-07-17 (×6): qty 1

## 2021-07-17 MED ORDER — DIPHENHYDRAMINE HCL 50 MG/ML IJ SOLN: 12.5000 mg | INTRAMUSCULAR | Status: DC | PRN

## 2021-07-17 MED ORDER — BUTORPHANOL TARTRATE 1 MG/ML IJ SOLN
INTRAMUSCULAR | Status: AC
  Filled 2021-07-17: qty 1

## 2021-07-17 MED ORDER — DIBUCAINE (PERIANAL) 1 % EX OINT: 1.0000 "application " | TOPICAL_OINTMENT | CUTANEOUS | Status: DC | PRN

## 2021-07-17 MED ORDER — TETANUS-DIPHTH-ACELL PERTUSSIS 5-2.5-18.5 LF-MCG/0.5 IM SUSY: 0.5000 mL | PREFILLED_SYRINGE | Freq: Once | INTRAMUSCULAR | Status: DC

## 2021-07-17 MED ORDER — LIDOCAINE HCL (PF) 1 % IJ SOLN
INTRAMUSCULAR | Status: DC | PRN
Start: 2021-07-17 — End: 2021-07-17
  Administered 2021-07-17: 11 mL via EPIDURAL

## 2021-07-17 MED ORDER — BUTORPHANOL TARTRATE 1 MG/ML IJ SOLN
1.0000 mg | Freq: Once | INTRAMUSCULAR | Status: AC
  Administered 2021-07-17: 1 mg via INTRAVENOUS

## 2021-07-17 MED ORDER — PRENATAL MULTIVITAMIN CH
1.0000 | ORAL_TABLET | Freq: Every day | ORAL | Status: DC
  Administered 2021-07-18 – 2021-07-19 (×2): 1 via ORAL
  Filled 2021-07-17 (×2): qty 1

## 2021-07-17 MED ORDER — SIMETHICONE 80 MG PO CHEW: 80.0000 mg | CHEWABLE_TABLET | ORAL | Status: DC | PRN

## 2021-07-17 MED ORDER — ONDANSETRON HCL 4 MG PO TABS: 4.0000 mg | ORAL_TABLET | ORAL | Status: DC | PRN

## 2021-07-17 MED ORDER — ACETAMINOPHEN 325 MG PO TABS
650.0000 mg | ORAL_TABLET | ORAL | Status: DC | PRN
  Administered 2021-07-19 (×2): 650 mg via ORAL
  Filled 2021-07-17 (×2): qty 2

## 2021-07-17 MED ORDER — BENZOCAINE-MENTHOL 20-0.5 % EX AERO: 1.0000 "application " | INHALATION_SPRAY | CUTANEOUS | Status: DC | PRN

## 2021-07-17 MED ORDER — MEASLES, MUMPS & RUBELLA VAC IJ SOLR: 0.5000 mL | Freq: Once | INTRAMUSCULAR | Status: DC

## 2021-07-17 MED ORDER — COCONUT OIL OIL: 1.0000 "application " | TOPICAL_OIL | Status: DC | PRN

## 2021-07-17 MED ORDER — PHENYLEPHRINE 40 MCG/ML (10ML) SYRINGE FOR IV PUSH (FOR BLOOD PRESSURE SUPPORT): 80.0000 ug | PREFILLED_SYRINGE | INTRAVENOUS | Status: DC | PRN

## 2021-07-17 MED ORDER — LACTATED RINGERS IV SOLN: 500.0000 mL | Freq: Once | INTRAVENOUS | Status: DC

## 2021-07-17 MED ORDER — DIPHENHYDRAMINE HCL 25 MG PO CAPS: 25.0000 mg | ORAL_CAPSULE | Freq: Four times a day (QID) | ORAL | Status: DC | PRN

## 2021-07-17 MED ORDER — MISOPROSTOL 50MCG HALF TABLET
50.0000 ug | ORAL_TABLET | ORAL | Status: DC | PRN
  Administered 2021-07-17 (×2): 50 ug via BUCCAL
  Filled 2021-07-17 (×2): qty 1

## 2021-07-17 MED ORDER — BUTORPHANOL TARTRATE 1 MG/ML IJ SOLN
1.0000 mg | Freq: Once | INTRAMUSCULAR | Status: AC
  Administered 2021-07-17: 1 mg via INTRAVENOUS
  Filled 2021-07-17: qty 1

## 2021-07-17 MED ORDER — FENTANYL-BUPIVACAINE-NACL 0.5-0.125-0.9 MG/250ML-% EP SOLN
EPIDURAL | Status: AC
  Filled 2021-07-17: qty 250

## 2021-07-17 NOTE — Anesthesia Preprocedure Evaluation (Signed)
Anesthesia Evaluation  Patient identified by MRN, date of birth, ID band Patient awake    Reviewed: Allergy & Precautions, H&P , NPO status , Patient's Chart, lab work & pertinent test results  Airway Mallampati: II  TM Distance: >3 FB Neck ROM: Full    Dental no notable dental hx.    Pulmonary neg pulmonary ROS, former smoker,    Pulmonary exam normal breath sounds clear to auscultation       Cardiovascular hypertension, Pt. on medications negative cardio ROS Normal cardiovascular exam Rhythm:Regular Rate:Normal     Neuro/Psych Anxiety Depression negative neurological ROS  negative psych ROS   GI/Hepatic negative GI ROS, Neg liver ROS,   Endo/Other  negative endocrine ROS  Renal/GU negative Renal ROS  negative genitourinary   Musculoskeletal negative musculoskeletal ROS (+)   Abdominal (+) + obese,   Peds negative pediatric ROS (+)  Hematology negative hematology ROS (+)   Anesthesia Other Findings   Reproductive/Obstetrics (+) Pregnancy                             Anesthesia Physical Anesthesia Plan  ASA: 3  Anesthesia Plan: Epidural   Post-op Pain Management:    Induction:   PONV Risk Score and Plan:   Airway Management Planned:   Additional Equipment:   Intra-op Plan:   Post-operative Plan:   Informed Consent:   Plan Discussed with:   Anesthesia Plan Comments:         Anesthesia Quick Evaluation

## 2021-07-17 NOTE — Progress Notes (Signed)
Kathleen Joyce is a 35 y.o. G1P0000 at [redacted]w[redacted]d admitted for induction of labor due to [facility].  Subjective: Reports feeling contractions. Stadol helped.  Objective: BP 124/66   Pulse 77   Temp 98.5 F (36.9 C) (Oral)   Resp 18   Ht 5\' 10"  (1.778 m)   Wt (!) 147 kg   LMP 10/06/2020   BMI 46.49 kg/m  No intake/output data recorded. No intake/output data recorded.  FHT:  FHR: 130 bpm, variability: moderate,  accelerations:  Abscent,  decelerations:  Absent UC:   regular, every 2-5 minutes SVE:   Dilation: 4 Effacement (%): 50 Station: -2 Exam by:: West Pugh, RN  Labs: Lab Results  Component Value Date   WBC 8.4 07/16/2021   HGB 12.5 07/16/2021   HCT 38.5 07/16/2021   MCV 87.1 07/16/2021   PLT 157 07/16/2021    Assessment / Plan: Induction of labor due to gHTN, was 3.5cm at admission, started on Pitocin. Cervix unchanged on current check by RN. Cervix only about 50% effaced. Will plan to stop pitocin and give one dose of cytotec.   Recheck in 4 hrs and assess for restarting pitocin  gHTN:   BP currently normotensive. Continue to monitor Fetal Wellbeing:  Category I Pain Control:  IV pain meds I/D:   GBS neg  Renard Matter 07/17/2021, 3:33 AM

## 2021-07-17 NOTE — Progress Notes (Signed)
Kathleen Joyce is a 35 y.o. G1P0000 at [redacted]w[redacted]d  admitted for induction of labor due to [facility]  Subjective: Feels rested.  Objective: BP (!) 158/76   Pulse 67   Temp 98.7 F (37.1 C) (Oral)   Resp 15   Ht 5\' 10"  (1.778 m)   Wt (!) 147 kg   LMP 10/06/2020   SpO2 95%   BMI 46.49 kg/m  No intake/output data recorded. No intake/output data recorded.  FHT:  FHR: 140 bpm, variability: moderate,  accelerations:  Present,  decelerations:  Absent UC:   irregular SVE:   Dilation: 4 Effacement (%): 70 Station: -2 Exam by:: Nilsa Nutting, RN  Labs: Lab Results  Component Value Date   WBC 8.4 07/16/2021   HGB 12.5 07/16/2021   HCT 38.5 07/16/2021   MCV 87.1 07/16/2021   PLT 157 07/16/2021    Assessment / Plan: G1P0000 at [redacted]w[redacted]d  admitted for induction of labor due to gHTN.  Labor: prev on pitocin, at ~0300 effacement still 50%, now still 4 cm (which is unchanged since last check) and 70% effaced. Recommended starting pitocin. Patient would like to do another cytotec for further effacement. Discussed with team and given another cytotec.  -Recheck in 4 hrs and assess for restarting pitocin  gHTN   BP in 150s, continue to monitor Fetal Wellbeing:  Category I Pain Control:  IV pain meds I/D:   GBS neg   Renard Matter, MD, MPH OB Fellow, Faculty Practice

## 2021-07-17 NOTE — Lactation Note (Signed)
This note was copied from a baby's chart. Lactation Consultation Note  Patient Name: Kathleen Joyce [id number] Date: 07/17/2021 Reason for consult: L&D Initial assessment;Primapara;1st time breastfeeding;Term Age:34 hours  L&D consult with >60 minutes old infant and P1 mother. Congratulated family on newborn.  Baby is skin to skin prone on mother's chest. Offered assistance with latch, laid back position. Baby had good strong latch but loses depth as she sucks.Mother will need a hand pump for nipple eversion.   Discussed STS as ideal transition for infants after birth. Talked about primal reflexes. Explained [place] services availability during postpartum stay. Thanked family for their time.      Maternal Data Has patient been taught Hand Expression?: Yes Does the patient have breastfeeding experience prior to this delivery?: No  Feeding Mother's Current Feeding Choice: Breast Milk  LATCH Score Latch: Grasps breast easily, tongue down, lips flanged, rhythmical sucking.  Audible Swallowing: None  Type of Nipple: Flat (soft, pliable tissue)  Comfort (Breast/Nipple): Soft / non-tender  Hold (Positioning): Assistance needed to correctly position infant at breast and maintain latch.  LATCH Score: 6  Interventions Interventions: Breast feeding basics reviewed;Assisted with latch;Skin to skin;Breast massage;Hand express;Breast compression;Adjust position;Expressed milk  Discharge Pump: Personal  Consult Status Consult Status: Follow-up from L&D Date: 07/17/21 Follow-up type: In-patient    Kathleen Joyce 07/17/2021, 5:58 PM

## 2021-07-17 NOTE — Discharge Summary (Addendum)
Postpartum Discharge Summary     Patient Name: Kathleen Joyce DOB: January 23, 1986 MRN: [id number]  Date of admission: 07/16/2021 Delivery date:07/17/2021  Delivering provider: Renee Harder  Date of discharge: 07/19/2021  Admitting diagnosis: Chronic hypertension during pregnancy, antepartum [O10.919] Intrauterine pregnancy: [redacted]w[redacted]d    Secondary diagnosis:  Principal Problem:   Chronic hypertension during pregnancy, antepartum Active Problems:   SVD (spontaneous vaginal delivery)  Additional problems: None    Discharge diagnosis: Term Pregnancy Delivered                                              Post partum procedures: none Augmentation: Pitocin and Cytotec Complications: None  Hospital course: Induction of Labor With Vaginal Delivery   35y.o. yo G1P0000 at 350w2das admitted to the hospital 07/16/2021 for induction of labor.  Indication for induction:  chronic hypertension .  Patient had an uncomplicated labor course as follows: Membrane Rupture Time/Date: 1:00 PM ,07/17/2021   Delivery Method:Vaginal, Spontaneous  Episiotomy: None  Lacerations:  1st degree  Details of delivery can be found in separate delivery note.  Patient had a routine postpartum course.  BP since delivery with more than 3 reads in >1>035Kystolic therefore meets criteria for starting BP meds. Discussed recommendation with patient. Will continue Procardia 30 mg upon discharge. Patient is discharged home 07/19/21.  Newborn Data: Birth date:07/17/2021  Birth time:4:27 PM  Gender:Female  Living status:Living  Apgars:9 ,9  Weight:2890 g   Magnesium Sulfate received: No BMZ received: No Rhophylac:N/A MMR:N/A T-DaP:Given prenatally Flu: No Transfusion:No  Physical exam  Vitals:   07/18/21 0822 07/18/21 1446 07/18/21 2045 07/19/21 0509  BP: (!) 142/75 135/75 134/73 119/72  Pulse: 85 80 77 73  Resp: 17 18 18 18   Temp: 98.2 F (36.8 C) 97.8 F (36.6 C) 98.4 F (36.9 C) 98.2 F (36.8 C)   TempSrc: Oral Oral Oral Oral  SpO2: 99% 100% 100% 99%  Weight:      Height:       General: alert Lochia: appropriate Uterine Fundus: firm DVT Evaluation: No edema, no calf tenderness Labs: Lab Results  Component Value Date   WBC 8.7 07/17/2021   HGB 12.2 07/17/2021   HCT 38.0 07/17/2021   MCV 87.4 07/17/2021   PLT 135 (L) 07/17/2021   CMP Latest Ref Rng & Units 07/16/2021  Glucose 70 - 99 mg/dL 93  BUN 6 - 20 mg/dL 6  Creatinine 0.44 - 1.00 mg/dL 0.70  Sodium 135 - 145 mmol/L 135  Potassium 3.5 - 5.1 mmol/L 4.1  Chloride 98 - 111 mmol/L 104  CO2 22 - 32 mmol/L 22  Calcium 8.9 - 10.3 mg/dL 9.2  Total Protein 6.5 - 8.1 g/dL 6.8  Total Bilirubin 0.3 - 1.2 mg/dL 0.5  Alkaline Phos 38 - 126 U/L 125  AST 15 - 41 U/L 20  ALT 0 - 44 U/L 18   Edinburgh Score: Edinburgh Postnatal Depression Scale Screening Tool 07/17/2021  I have been able to laugh and see the funny side of things. (No Data)     After visit meds:  Allergies as of 07/19/2021       Reactions   Banana    Gi upset        Medication List     STOP taking these medications    aspirin EC 81 MG tablet  famotidine 40 MG tablet Commonly known as: PEPCID       TAKE these medications    acetaminophen 500 MG tablet Commonly known as: TYLENOL Take 2 tablets (1,000 mg total) by mouth every 8 (eight) hours as needed (for pain scale).   ibuprofen 600 MG tablet Commonly known as: ADVIL Take 1 tablet (600 mg total) by mouth every 6 (six) hours as needed (for pain).   NIFEdipine 30 MG 24 hr tablet Commonly known as: ADALAT CC Take 1 tablet (30 mg total) by mouth daily.   Prenatal Vitamin 27-0.8 MG Tabs Take 1 tablet by mouth daily.         Discharge home in stable condition Infant Feeding: Breast Infant Disposition:home with mother Discharge instruction: per After Visit Summary and Postpartum booklet. Activity: Advance as tolerated. Pelvic rest for 6 weeks.  Diet: routine diet Future  Appointments: Future Appointments  Date Time Provider [place]  07/24/2021 10:00 AM [facility] NURSE [facility]  08/27/2021 11:00 AM Berniece Salines, DO [facility]  08/28/2021  9:35 AM Aletha Halim, MD [facility]   Follow up Visit:  Message sent to [facility] by Maryagnes Amos, CNM  Please schedule this patient for a In person postpartum visit in 6 weeks with the following provider: Any provider. Additional Postpartum F/U:BP check 1 week  High risk pregnancy complicated by: HTN Delivery mode:  Vaginal, Spontaneous  Anticipated Birth Control:  Nexplanon outpatient   07/19/2021 Kristeen Miss, MD  CNM attestation I have seen and examined this patient and agree with above documentation in the resident's note.   Kathleen Joyce is a 35 y.o. G1P1001 s/p vag del.   Pain is well controlled.  Plan for birth control is  Nexplanon outpatient .  Method of Feeding: breast  PE:  BP 119/72 (BP Location: Left Arm)   Pulse 73   Temp 98.2 F (36.8 C) (Oral)   Resp 18   Ht 5' 10"  (1.778 m)   Wt (!) 147 kg   LMP 10/06/2020   SpO2 99%   Breastfeeding Unknown   BMI 46.49 kg/m  Fundus firm  Recent Labs    07/16/21 1413 07/17/21 1502  HGB 12.5 12.2  HCT 38.5 38.0     Plan: discharge today - postpartum care discussed - f/u clinic in 1wk for BP check, then 4-6 weeks for postpartum visit   Myrtis Ser, CNM 9:42 AM 07/19/2021

## 2021-07-17 NOTE — Progress Notes (Signed)
Kathleen Joyce is a 35 y.o. G1P0000 at [redacted]w[redacted]d admitted for IOL for cHTN  Subjective: Called to room by RN d/t variable decelerations.   Objective: BP 138/78   Pulse 77   Temp 98.5 F (36.9 C) (Oral)   Resp 15   Ht 5\' 10"  (1.778 m)   Wt (!) 147 kg   LMP 10/06/2020   SpO2 95%   BMI 46.49 kg/m  No intake/output data recorded. No intake/output data recorded.  FHT: 135 bpm, moderate variability, +10x10, variable decelerations  UC: toco not tracing, palpating q3 mins SVE:   Dilation: 5 Effacement (%): 80 Station: -2 Exam by:: Anda Kraft, CNM  Labs: Lab Results  Component Value Date   WBC 8.7 07/17/2021   HGB 12.2 07/17/2021   HCT 38.0 07/17/2021   MCV 87.4 07/17/2021   PLT 135 (L) 07/17/2021    Assessment / Plan: Kathleen Joyce is a 35 y.o. G1P0 at [redacted]w[redacted]d here for IOL for cHTN  Labor: Pitocin turned off due to questionable repetitive variable decelerations. IUPC and FSE placed.  cHTN: normotensive to mild range, asymptomatic Fetal Wellbeing:  Cat 2 Pain Control:  Epidural I/D:   GBS neg Anticipated MOD:  NSVD   Renee Harder, CNM 07/17/2021, 3:54 PM

## 2021-07-17 NOTE — Anesthesia Procedure Notes (Signed)
Epidural Patient location during procedure: OB Start time: 07/17/2021 4:08 PM End time: 07/17/2021 4:19 PM  Staffing Anesthesiologist: Lynda Rainwater, MD Performed: other anesthesia staff   Preanesthetic Checklist Completed: patient identified, IV checked, site marked, risks and benefits discussed, surgical consent, monitors and equipment checked, pre-op evaluation and timeout performed  Epidural Patient position: sitting Prep: ChloraPrep Patient monitoring: heart rate, cardiac monitor, continuous pulse ox and blood pressure Approach: midline Injection technique: LOR air  Needle:  Needle type: Tuohy  Needle gauge: 17 G Needle length: 9 cm Needle insertion depth: 8 cm Catheter type: closed end flexible Catheter size: 20 Guage Catheter at skin depth: 13 cm Test dose: negative  Assessment Events: blood not aspirated, injection not painful, no injection resistance, no paresthesia and negative IV test  Additional Notes Epidural placed by SRNA under direct supervisionReason for block:procedure for pain

## 2021-07-18 MED ORDER — NIFEDIPINE ER OSMOTIC RELEASE 30 MG PO TB24
30.0000 mg | ORAL_TABLET | Freq: Every day | ORAL | Status: DC
  Administered 2021-07-18 – 2021-07-19 (×2): 30 mg via ORAL
  Filled 2021-07-18 (×2): qty 1

## 2021-07-18 NOTE — Progress Notes (Signed)
CSW met with MOB to complete consult for mental health. CSW observed MOB resting in bed, infant in bassinet, and FOB laying on couch. MOB gave CSW verbal consent to complete consult while FOB was present. CSW explained role, and reason for consult. MOB was pleasant, and polite during engagement with CSW. MOB reported, history of anxiety, and depression while grieving losses during her pregnancy. MOB reported, she loss her father in February when she was first aware of pregnancy. Also, during her pregnancy she had to make all of her mother's end of life decisions. MOB reported, she eventually loss her mother at the beginning of this month (November). MOB reported, history of Wellbutrin at the ages of 21-22 yrs old. MOB reported, since then she has been able to manage symptoms without medication. CSW encourage MOB to implement healthy coping skills when symptoms arises.   CSW provided education regarding the baby blues period vs. perinatal mood disorders, discussed treatment and gave resources for mental health follow up if concerns arise. CSW recommends self- evaluation during the postpartum time period using the New Mom Checklist from Postpartum Progress and encouraged MOB to contact a medical professional if symptoms are noted at any time.   MOB reported, since delivery she feels, "great". MOB reported, FOB, her uncle, and pastor are very supportive. MOB denied SI, and HI when CSW assessed for safety.   MOB reported, there are no transportation barriers to follow up infant's care. MOB reported, she has all essentials needed to care for infant. MOB reported, infant has a car seat, and bassinet. MOB denied any additional barriers.     CSW provided education on sudden infant death syndrome (SIDS).  CSW identifies no further need for intervention or barriers to discharge at this time.  Madalena Kesecker, MSW, LCSW-A Clinical Social Worker- Weekends [phone]  

## 2021-07-18 NOTE — Lactation Note (Signed)
This note was copied from a baby's chart. Lactation Consultation Note  Patient Name: Kathleen Joyce Date: 07/18/2021   Age:34 hours  LC tried to see Mom 2x, first time eating breakfast, second time in the shower. Dad to call for lactation when she is available.   Maternal Data    Feeding    LATCH Score                    Lactation Tools Discussed/Used    Interventions    Discharge    Consult Status      Kathleen Fairley  Joyce 07/18/2021, 12:06 PM

## 2021-07-18 NOTE — Lactation Note (Signed)
This note was copied from a baby's chart. Lactation Consultation Note  Patient Name: Kathleen Joyce [id number] Date: 07/18/2021 Reason for consult: Initial assessment;Mother's request;Difficult latch;Primapara;1st time breastfeeding;Breastfeeding assistance;Other (Comment) (Anemia, Chronic HTN) Age:34 hours LC able to sustain latch with help of 20 NS but caused pain along the diameter of nipple for mother. LC tried 24 NS, too large infant not able to get enough depth.  We reviewed latching with infant cross cradle prone on her stomach with next feeding.   Plan 1. To feed based on cues 8-12x 24hr period. Mom to offer breasts and look for signs of milk transfer.  2. Mom to supplement with eBM first followed by [facility] with pace bottle feeding and slow flow nipple (7-12 ml)  3. DEBP q 3hrs for 48min  4. I and O sheet reviewed.  All questions answered at the end of the visit.  Mom to call for latch assistance with RN or LC for next feeding.  Maternal Data Has patient been taught Hand Expression?: Yes  Feeding Mother's Current Feeding Choice: Breast Milk and Donor Milk  LATCH Score Latch: Repeated attempts needed to sustain latch, nipple held in mouth throughout feeding, stimulation needed to elicit sucking reflex.  Audible Swallowing: A few with stimulation  Type of Nipple: Flat  Comfort (Breast/Nipple): Soft / non-tender  Hold (Positioning): Assistance needed to correctly position infant at breast and maintain latch.  LATCH Score: 6   Lactation Tools Discussed/Used Tools: Nipple Shields Nipple shield size: 20  Interventions Interventions: Breast feeding basics reviewed;Adjust position;DEBP;Assisted with latch;Support pillows;Skin to skin;Position options;Education;Expressed milk;Pace feeding;Breast massage;Hand express;LC Magazine features editor;Infant Driven Feeding Algorithm education;Breast compression;Hand pump;Pre-pump if needed  Discharge Pump: Personal WIC Program:  No  Consult Status Consult Status: Follow-up Date: 07/19/21 Follow-up type: In-patient    Umi Mainor  Nicholson-Springer 07/18/2021, 2:18 PM

## 2021-07-18 NOTE — Anesthesia Postprocedure Evaluation (Signed)
Anesthesia Post Note  Patient: Kathleen Joyce  Procedure(s) Performed: AN AD HOC LABOR EPIDURAL     Patient location during evaluation: Mother Baby Anesthesia Type: Epidural Level of consciousness: awake and alert and oriented Pain management: satisfactory to patient Vital Signs Assessment: post-procedure vital signs reviewed and stable Respiratory status: respiratory function stable Cardiovascular status: stable Postop Assessment: no headache, no backache, epidural receding, patient able to bend at knees, no signs of nausea or vomiting, adequate PO intake and able to ambulate Anesthetic complications: no   No notable events documented.  Last Vitals:  Vitals:   07/18/21 0341 07/18/21 0822  BP: 134/80 (!) 142/75  Pulse: 72 85  Resp:  17  Temp: 36.8 C 36.8 C  SpO2: 100% 99%    Last Pain:  Vitals:   07/18/21 0822  TempSrc: Oral  PainSc:    Pain Goal: Patients Stated Pain Goal: 10 (07/16/21 1836)                 [name]

## 2021-07-18 NOTE — Progress Notes (Signed)
POSTPARTUM PROGRESS NOTE  Subjective: Kathleen Joyce is a 35 y.o. G1P1001 PPD#1 s/p SVD at [redacted]w[redacted]d.  She reports she doing well. No acute events overnight. She denies any problems with ambulating, voiding or po intake. Denies nausea or vomiting. She has passed flatus. Pain is well controlled.  Lochia is scant.  Objective: Blood pressure 134/80, pulse 72, temperature 98.3 F (36.8 C), resp. rate 18, height 5\' 10"  (1.778 m), weight (!) 147 kg, last menstrual period 10/06/2020, SpO2 100 %, unknown if currently breastfeeding.  Physical Exam:  General: alert, cooperative and no distress Chest: no respiratory distress Abdomen: soft, non-tender  Uterine Fundus: firm, appropriately tender Extremities: No calf swelling or tenderness  trace edema  Recent Labs    07/16/21 1413 07/17/21 1502  HGB 12.5 12.2  HCT 38.5 38.0    Assessment/Plan: Rayshell Z Schmuck is a 35 y.o. G1P1001 PPD#1 s/p SVD at [redacted]w[redacted]d for IOL for gHTN.  Routine Postpartum Care: Doing well, pain well-controlled.  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 Not previously on meds. BP since delivery with more than 3 reads in >161W systolic therefore meets criteria for starting BP meds. Discussed recommendation with patient. At this time patient would like to think about it more before taking medications - informed that procardia ordered for 10 AM - informed risks of BP elevation and especially since previously with cHTN and importance of preventing worsening BP and sequelae - Discussed that usually kept on for about 6 weeks and then can trial discontinuation to see if needs it chronically or not - patient expresses understanding and states will think about it and let team know if she is amenable with taking Procardia  Dispo: Patient still deciding whether would like to DC today or on Day 2. If baby being  discharged and patient would like to go home on Day 1 ok for DC later today after 24 hrs  Renard Matter, MD, MPH OB Fellow, [facility] for [facility]

## 2021-07-19 MED ORDER — NIFEDIPINE ER 30 MG PO TB24
30.0000 mg | ORAL_TABLET | Freq: Every day | ORAL | 1 refills | Status: DC
Start: 2021-07-19 — End: 2021-07-19

## 2021-07-19 MED ORDER — NIFEDIPINE ER 30 MG PO TB24: 30.0000 mg | ORAL_TABLET | Freq: Every day | ORAL | 1 refills | Status: DC

## 2021-07-19 MED ORDER — IBUPROFEN 600 MG PO TABS: 600.0000 mg | ORAL_TABLET | Freq: Four times a day (QID) | ORAL | 0 refills | Status: DC | PRN

## 2021-07-19 MED ORDER — ACETAMINOPHEN 500 MG PO TABS: 1000.0000 mg | ORAL_TABLET | Freq: Three times a day (TID) | ORAL | 0 refills | Status: AC | PRN

## 2021-07-19 MED ORDER — ACETAMINOPHEN 500 MG PO TABS: 1000.0000 mg | ORAL_TABLET | Freq: Three times a day (TID) | ORAL | 0 refills | Status: DC | PRN

## 2021-07-19 MED ORDER — IBUPROFEN 600 MG PO TABS: 600.0000 mg | ORAL_TABLET | Freq: Four times a day (QID) | ORAL | 0 refills | Status: AC | PRN

## 2021-07-19 NOTE — Lactation Note (Signed)
This note was copied from a baby's chart. Lactation Consultation Note  Patient Name: Kathleen Joyce [id number] Date: 07/19/2021 Reason for consult: Follow-up assessment;Primapara;Term Age:34 hours  Parents felt that the Similac slow-flow nipple was too fast; I tried the Nfant standard flow (slower than the Similac version) and infant did well. The hand-out "Bottle feeding your baby" was provided to show volume parameters and feeding/satiety cues.  Mom knows to pump when infant gets a bottle. I anticipate infant will latch better as Mom's milk comes to volume and as infant is able to obtain getting a wider gape. Latching was attempted and although not a wide latch, Mom was pleased with the progress.  Parents' questions were answered to their satisfaction. Parents know how to reach [place] for any post-discharge questions & Mom is aware of [facility]. Parents will be moving back to [place] in January.   Matthias Hughs [facility] 07/19/2021, 2:56 PM

## 2021-07-21 ENCOUNTER — Other Ambulatory Visit: Payer: Self-pay

## 2021-07-21 ENCOUNTER — Ambulatory Visit (INDEPENDENT_AMBULATORY_CARE_PROVIDER_SITE_OTHER): Payer: Managed Care, Other (non HMO)

## 2021-07-21 VITALS — BP 125/74 | HR 105 | Wt 315.0 lb

## 2021-07-21 DIAGNOSIS — Z0130 Encounter for examination of blood pressure without abnormal findings: Secondary | ICD-10-CM

## 2021-07-21 NOTE — Progress Notes (Signed)
Pt here today requesting a BP check as she states that she delivered baby three days and had an elevated BP at home today.  Pt reports that she has had a headache that went away with Tylenol.  Pt states that she took (3) 325 mg tablets of Tylenol and extra tablet of Procardia 30 mg and rushed to the office.   BP LA via dynamap 125/74.  Pt advised to not take extra dose of Procardia to continue with one tablet as originally prescribed.  Pt advised to keep BP appt scheduled on Friday 07/24/21.  Pt verbalized understanding with no further questions.  Verdell Carmine, RN

## 2021-07-22 NOTE — Progress Notes (Signed)
Chart reviewed for nurse visit. Agree with plan of care.   Clarnce Flock, MD 07/22/21 12:52 PM

## 2021-07-24 ENCOUNTER — Other Ambulatory Visit: Payer: Self-pay

## 2021-07-24 ENCOUNTER — Ambulatory Visit (INDEPENDENT_AMBULATORY_CARE_PROVIDER_SITE_OTHER): Payer: Managed Care, Other (non HMO) | Admitting: General Practice

## 2021-07-24 VITALS — BP 115/79 | HR 76 | Ht 70.0 in | Wt 313.0 lb

## 2021-07-24 DIAGNOSIS — Z0130 Encounter for examination of blood pressure without abnormal findings: Secondary | ICD-10-CM

## 2021-07-24 NOTE — Progress Notes (Signed)
Patient presents to office today for BP check following vaginal delivery on 11/25. Past medical history is significant for [facility]- has been taking nifedipine daily as prescribed. Patient denies headaches, dizziness or blurry vision since last visit. No edema noted. BP 115/79. Patient will follow up at pp visit on 1/6.  Koren Bound RN BSN 07/24/21

## 2021-07-24 NOTE — Progress Notes (Signed)
Chart reviewed for nurse visit. Agree with plan of care.   Clarnce Flock, MD 07/24/21 12:49 PM

## 2021-07-28 NOTE — BH Specialist Note (Deleted)
Integrated Behavioral Health via Telemedicine Visit  07/28/2021 Kathleen Joyce [id number]  Number of [place] visits: *** Session Start time: 9:15***  Session End time: 9:45*** Total time: {IBH Total Time:21014050}  Referring Provider: *** Patient/Family location: Home*** [facility] Provider location:Center for [employer] [facility] at [facility]  All persons participating in visit: Patient *** and Kathleen Joyce ***  Types of Service: {CHL AMB TYPE OF SERVICE:[phone]}  I connected with Kathleen Joyce and/or Kathleen Joyce's {family members:20773} via  Telephone or Geologist, engineering  (Video is Tree surgeon) and verified that I am speaking with the correct person using two identifiers. Discussed confidentiality: {YES/NO:21197}  I discussed the limitations of telemedicine and the availability of in person appointments.  Discussed there is a possibility of technology failure and discussed alternative modes of communication if that failure occurs.  I discussed that engaging in this telemedicine visit, they consent to the provision of behavioral healthcare and the services will be billed under their insurance.  Patient and/or legal guardian expressed understanding and consented to Telemedicine visit: {YES/NO:21197}  Presenting Concerns: Patient and/or family reports the following symptoms/concerns: *** Duration of problem: ***; Severity of problem: {Mild/Moderate/Severe:20260}  Patient and/or Family's Strengths/Protective Factors: {CHL AMB BH PROTECTIVE FACTORS:[phone]}  Goals Addressed: Patient will:  Reduce symptoms of: {IBH Symptoms:21014056}   Increase knowledge and/or ability of: {IBH Patient Tools:21014057}   Demonstrate ability to: {IBH Goals:21014053}  Progress towards Goals: {CHL AMB BH PROGRESS TOWARDS GOALS:[phone]}  Interventions: Interventions utilized:  {IBH  Interventions:21014054} Standardized Assessments completed: {IBH Screening Tools:21014051}  Patient and/or Family Response: ***  Assessment: Patient currently experiencing ***.   Patient may benefit from ***.  Plan: Follow up with behavioral health clinician on : *** Behavioral recommendations: *** Referral(s): {IBH Referrals:21014055}  I discussed the assessment and treatment plan with the patient and/or parent/guardian. They were provided an opportunity to ask questions and all were answered. They agreed with the plan and demonstrated an understanding of the instructions.   They were advised to call back or seek an in-person evaluation if the symptoms worsen or if the condition fails to improve as anticipated.  Caroleen Hamman Camrynn Mcclintic, LCSW

## 2021-07-30 ENCOUNTER — Telehealth (HOSPITAL_COMMUNITY): Payer: Self-pay | Admitting: *Deleted

## 2021-07-30 NOTE — Telephone Encounter (Signed)
Mom reports feeling good. Reports large clot expelled a few days ago. Talked with nurse about it at BP check appt. Discussed lochia and changes in discharge after birth. No other concerns about herself at this time, except dealing with death of her mom prior to birth of baby. Has an appt with [name] at [facility]. EPDS=8 [facility] score=4) Mom reports baby is doing well. Feeding, peeing, and pooping without difficulty. Safe sleep reviewed. Mom reports no concerns about baby at present.  Odis Hollingshead, RN 07-30-2021 at 2:30pm

## 2021-08-03 NOTE — BH Specialist Note (Signed)
Integrated Behavioral Health via Telemedicine Visit  08/03/2021 [name] [id number]  Number of [place] visits: 7 Session Start time: 1:50  Session End time: 2:17 Total time:  27  Referring Provider: Lynnda Shields, MD Patient/Family location: Home [facility] Provider location: Center for [facility] at [facility] for [facility]  All persons participating in visit: [name] and [place]   Types of Service: Individual psychotherapy and Video visit  I connected with Kathleen Joyce and/or Kathleen Joyce's  n/a  via  Telephone or Geologist, engineering  (Video is Tree surgeon) and verified that I am speaking with the correct person using two identifiers. Discussed confidentiality: Yes   I discussed the limitations of telemedicine and the availability of in person appointments.  Discussed there is a possibility of technology failure and discussed alternative modes of communication if that failure occurs.  I discussed that engaging in this telemedicine visit, they consent to the provision of behavioral healthcare and the services will be billed under their insurance.  Patient and/or legal guardian expressed understanding and consented to Telemedicine visit: Yes   Presenting Concerns: Patient and/or family reports the following symptoms/concerns: Adjusting to new motherhood while grieving recent loss of mother Duration of problem: Over two weeks; Severity of problem: moderate  Patient and/or Family's Strengths/Protective Factors: Social connections, Social and Emotional competence, Concrete supports in place (healthy food, safe environments, etc.), Sense of purpose, and Physical Health (exercise, healthy diet, medication compliance, etc.)  Goals Addressed: Patient will:  Demonstrate ability to: Increase healthy adjustment to current life circumstances and Continue healthy grieving over loss  Progress  towards Goals: Ongoing  Interventions: Interventions utilized:  Supportive Reflection Standardized Assessments completed: Not Needed  Patient and/or Family Response: Pt agrees with treatment plan  Assessment: Patient currently experiencing Grief.   Patient may benefit from continue brief therapeutic interventions.  Plan: Follow up with behavioral health clinician on : Two weeks for Comprehensive Clinical Assessment (CCA) Behavioral recommendations:  -Continue accepting all offers of practical support from supportive family, friends; continue working on team with husband with sleep "shifts" -Continue prioritizing healthy self-care daily for the next two weeks: sleeping, eating, allowing grief and sense of peace Referral(s): [place] (In Clinic)  I discussed the assessment and treatment plan with the patient and/or parent/guardian. They were provided an opportunity to ask questions and all were answered. They agreed with the plan and demonstrated an understanding of the instructions.   They were advised to call back or seek an in-person evaluation if the symptoms worsen or if the condition fails to improve as anticipated.  [name], LCSW  Depression screen [facility] 2/9 07/13/2021 07/06/2021 06/29/2021 06/23/2021 06/15/2021  Decreased Interest 1 0 1 1 2   Down, Depressed, Hopeless 0 0 1 1 2   PHQ - 2 Score 1 0 2 2 4   Altered sleeping 1 0 1 1 3   Tired, decreased energy 1 0 1 1 1   Change in appetite 0 0 1 1 1   Feeling bad or failure about yourself  0 0 0 1 0  Trouble concentrating 0 0 0 1 0  Moving slowly or fidgety/restless 0 0 1 0 0  Suicidal thoughts 0 0 0 0 0  PHQ-9 Score 3 0 6 7 9   Difficult doing work/chores - - - - -   GAD 7 : Generalized Anxiety Score 07/13/2021 07/06/2021 06/29/2021 06/23/2021  Nervous, Anxious, on Edge 0 0 0 1  Control/stop worrying  0 0 0 1  Worry too much - different things 0 0 0 1  Trouble relaxing 0 0 0 1  Restless 0 0 0  1  Easily annoyed or irritable 1 0 0 1  Afraid - awful might happen 0 0 0 1  Total GAD 7 Score 1 0 0 7  Anxiety Difficulty - - - -    Edinburgh Postnatal Depression Scale Screening Tool 07/30/2021 07/19/2021 07/17/2021  I have been able to laugh and see the funny side of things. 0 0 (No Data)  I have looked forward with enjoyment to things. 0 0 -  I have blamed myself unnecessarily when things went wrong. 2 1 -  I have been anxious or worried for no good reason. 2 2 -  I have felt scared or panicky for no good reason. 1 0 -  Things have been getting on top of me. 1 0 -  I have been so unhappy that I have had difficulty sleeping. 0 1 -  I have felt sad or miserable. 1 0 -  I have been so unhappy that I have been crying. 1 0 -  The thought of harming myself has occurred to me. 0 0 -  Edinburgh Postnatal Depression Scale Total 8 4 -

## 2021-08-04 ENCOUNTER — Ambulatory Visit (INDEPENDENT_AMBULATORY_CARE_PROVIDER_SITE_OTHER): Payer: 59 | Admitting: Clinical

## 2021-08-04 DIAGNOSIS — F4321 Adjustment disorder with depressed mood: Secondary | ICD-10-CM | POA: Diagnosis not present

## 2021-08-11 NOTE — BH Specialist Note (Signed)
Integrated Behavioral Health via Telemedicine Visit  08/25/2021 CIMBERLY STOFFEL [id number]  Number of [place] visits: 8 Session Start time: 10:17  Session End time: 11:01 Total time:  30  Referring Provider: Lynnda Shields, MD Patient/Family location: Home [facility] Provider location: Center for [facility] Healthcare at [facility] for Women  All persons participating in visit: Patient Eufemia Sroka and [place]   Types of Service: Individual psychotherapy and Video visit  I connected with [place] and/or Fallen Z Salek's  n/a  via  Telephone or Geologist, engineering  (Video is Tree surgeon) and verified that I am speaking with the correct person using two identifiers. Discussed confidentiality: Yes   I discussed the limitations of telemedicine and the availability of in person appointments.  Discussed there is a possibility of technology failure and discussed alternative modes of communication if that failure occurs.  I discussed that engaging in this telemedicine visit, they consent to the provision of behavioral healthcare and the services will be billed under their insurance.  Patient and/or legal guardian expressed understanding and consented to Telemedicine visit: Yes   Presenting Concerns: Patient and/or family reports the following symptoms/concerns: Processing the life changes of the past year, with a shift in focus postpartum, and upcoming move back to [place] Duration of problem: Postpartum; Severity of problem: mild  Patient and/or Family's Strengths/Protective Factors: Social connections, Social and Emotional competence, Concrete supports in place (healthy food, safe environments, etc.), Sense of purpose, Physical Health (exercise, healthy diet, medication compliance, etc.), and Caregiver has knowledge of parenting & child development  Goals Addressed: Patient will:  Demonstrate ability to: Increase  healthy adjustment to current life circumstances and Increase adequate support systems for patient/family  Progress towards Goals: Achieved  Interventions: Interventions utilized:  Link to [employer] and Supportive Reflection Standardized Assessments completed: Not Needed  Patient and/or Family Response: Pt managing well in the midst of big life changes; will establish with therapist after move to [place]  Assessment: 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   Patient may benefit from continued brief therapeutic interventions today.  Plan: Follow up with behavioral health clinician on : N/A Behavioral recommendations:  -Continue using self-coping and self-reflection to manage emotions during this transitional time -Continue plan to write out your life story, in your own truth; share with family as needed -Continue plan to establish with ongoing therapist after move -Consider online support group for new moms at www.postpartum.net, for additional helpful supprt Referral(s): Community Resources:  new mom support  I discussed the assessment and treatment plan with the patient and/or parent/guardian. They were provided an opportunity to ask questions and all were answered. They agreed with the plan and demonstrated an understanding of the instructions.   They were advised to call back or seek an in-person evaluation if the symptoms worsen or if the condition fails to improve as anticipated.  Caroleen Hamman Alaney Witter, LCSW

## 2021-08-25 ENCOUNTER — Ambulatory Visit (INDEPENDENT_AMBULATORY_CARE_PROVIDER_SITE_OTHER): Payer: 59 | Admitting: Clinical

## 2021-08-25 DIAGNOSIS — F4323 Adjustment disorder with mixed anxiety and depressed mood: Secondary | ICD-10-CM | POA: Diagnosis not present

## 2021-08-25 DIAGNOSIS — F4321 Adjustment disorder with depressed mood: Secondary | ICD-10-CM

## 2021-08-27 ENCOUNTER — Ambulatory Visit: Payer: Managed Care, Other (non HMO) | Admitting: Cardiology

## 2021-08-28 ENCOUNTER — Other Ambulatory Visit: Payer: Self-pay

## 2021-08-28 ENCOUNTER — Ambulatory Visit (INDEPENDENT_AMBULATORY_CARE_PROVIDER_SITE_OTHER): Payer: Managed Care, Other (non HMO) | Admitting: Obstetrics and Gynecology

## 2021-08-28 ENCOUNTER — Encounter: Payer: Self-pay | Admitting: Obstetrics and Gynecology

## 2021-08-28 VITALS — BP 128/84 | HR 82 | Wt 306.6 lb

## 2021-08-28 DIAGNOSIS — Z30017 Encounter for initial prescription of implantable subdermal contraceptive: Secondary | ICD-10-CM | POA: Diagnosis not present

## 2021-08-28 HISTORY — PX: INSERTION OF IMPLANON ROD: OBO 1005

## 2021-08-28 LAB — POCT PREGNANCY, URINE: Preg Test, Ur: NEGATIVE

## 2021-08-28 MED ORDER — ETONOGESTREL 68 MG ~~LOC~~ IMPL
68.0000 mg | DRUG_IMPLANT | Freq: Once | SUBCUTANEOUS | Status: AC
  Administered 2021-08-28: 68 mg via SUBCUTANEOUS

## 2021-08-28 NOTE — Procedures (Signed)
Nexplanon Insertion Procedure Note Prior to the procedure being performed, the patient was asked to state their full name, date of birth, type of procedure being performed and the exact location of the operative site. This information was then checked against the documentation in the patient's chart. Prior to the procedure being performed, a "time out" was performed by the physician that confirmed the correct patient, procedure and site.  After informed consent was obtained, the patient's non-dominant right arm was chosen for insertion. A site was marked approximately 8 cm proximal to the medial epicondyle in the below the sulcus between the biceps and triceps on the inner surface at the fat pad. The area was cleaned with alcohol then local anesthesia was infiltrated with 3 ml of 1% lidocaine along the planned insertion track. The area was prepped with betadine. Using sterile technique the Nexplanon device was inserted per manufacturer's guidelines in the subdermal connective tissue using the standard insertion technique without difficulty. Pressure was applied and the insertion site was hemostatic. The presence of the Nexplanon was confirmed immediately after insertion by palpation by both me and the patient and by checking the tip of needle for the absence of the insert.  A pressure dressing was applied.  The patient tolerated the procedure well. Patient told to consider effective in one week.   Durene Romans MD Attending Center for [employer] Fish farm manager)

## 2021-08-28 NOTE — Progress Notes (Signed)
° ° °  [facility] Visit Note  Kathleen Joyce is a 36 y.o. G1P1001 s/p 11/25 SVD/1st degree (repaired) and b/l periuretheral (repaired) at 17w 2d gestational weeks after IOL for cHTN.  Anesthesia: epidural. Postpartum course has been uncomplicated. Baby is doing well. Baby is feeding by both breast and bottle - Similac 360 . Bleeding staining only. Bowel function is normal. Bladder function is .normal Patient is not sexually active. Contraception method is abstinence; pt desires Nexplanon today. Postpartum depression screening: negative.  Patient just took her procardia xl 30 qday in the office today    Edinburgh Postnatal Depression Scale - 08/28/21 1119       Edinburgh Postnatal Depression Scale:  In the Past 7 Days   I have been able to laugh and see the funny side of things. 0    I have looked forward with enjoyment to things. 0    I have blamed myself unnecessarily when things went wrong. 0    I have been anxious or worried for no good reason. 0    I have felt scared or panicky for no good reason. 0    Things have been getting on top of me. 1    I have been so unhappy that I have had difficulty sleeping. 0    I have felt sad or miserable. 0    I have been so unhappy that I have been crying. 0    The thought of harming myself has occurred to me. 0    Edinburgh Postnatal Depression Scale Total 1             Health Maintenance Due  Topic Date Due   [name] Vaccine (1) Never done   Pneumococcal Vaccine 50-86 Years old (1 - PCV) Never done    Review of Systems Pertinent items noted in HPI and remainder of comprehensive ROS otherwise negative.  Objective:  BP 128/84    Pulse 82    Wt (!) 306 lb 9.6 oz (139.1 kg)    Breastfeeding Yes    BMI 43.99 kg/m    NAD Abdomen: soft, nttp, nd EGBUS normal Vagina: normal Cervix: normal Bimanual: negative  See procedure note for nexplanon placement in LUE Assessment:    Normal postpartum exam.   Plan:  *PP: routine care. Pt  told to wait one week before considering nexplanon effective. Patient is moving back to [place] in a few weeks *cHTN: pt on no meds for her cHTN pre pregnancy. Pt told to d/c procardia and continue with qday BP checks at home and will f/u in the office for a BP check in 1-2wks.  *Anxiety/depression: pt doing well on no meds; she has seen [name] with [facility] for routine f/u.   RTC: 1-2wks for BP check  Aletha Halim, MD [facility]

## 2021-08-29 ENCOUNTER — Encounter: Payer: Self-pay | Admitting: Obstetrics and Gynecology

## 2021-09-03 ENCOUNTER — Telehealth (INDEPENDENT_AMBULATORY_CARE_PROVIDER_SITE_OTHER): Payer: Managed Care, Other (non HMO)

## 2021-09-03 VITALS — BP 128/77 | HR 57

## 2021-09-03 DIAGNOSIS — Z0130 Encounter for examination of blood pressure without abnormal findings: Secondary | ICD-10-CM

## 2021-09-03 NOTE — Progress Notes (Addendum)
Blood Pressure Check Visit  I connected with  Kathleen Joyce on 09/03/21 at  9:20 AM EST by telephone and verified that I am speaking with the correct person using two identifiers.  Visit today is for blood pressure check following postpartum visit on 08/28/21. Pt was directed at [facility] visit to stop nifedipine. Pt verifies she has not taken nifedipine since PP visit. BP today is 128/77. Denies any s/s of elevated BP. Recommended patient follow up with primary care provider. Pt verbalizes she will establish with PCP following move to [place]. Explained to pt she may follow up with our office as needed.  Annabell Howells, RN 09/03/2021  9:19 AM   Addend: Approx 10 minutes spent on telephone encounter on 09/03/21 with patient. Apolonio Schneiders RN 09/11/21

## 2021-09-04 NOTE — Progress Notes (Signed)
Patient was assessed and managed by nursing staff during this encounter. I have reviewed the chart and agree with the documentation and plan. I have also made any necessary editorial changes.  Aletha Halim, MD 09/04/2021 10:08 AM

## 2021-09-29 ENCOUNTER — Encounter: Payer: Self-pay | Admitting: Obstetrics and Gynecology

## 2021-10-12 IMAGING — US US OB COMP LESS 14 WK
1 series · 15 of 28 positions shown · non-contrast
Comparison: December 10, 2019

CLINICAL DATA: Vaginal bleeding, no beta HCG reported at time of
interpretation

EXAM:
OBSTETRIC <14 WK ULTRASOUND
TECHNIQUE: Transabdominal ultrasound was performed for evaluation of the
gestation as well as the maternal uterus and adnexal regions.

[Series 1: us ob comp less 14 wk · 15 of 32 slices shown]
[im 1/32]
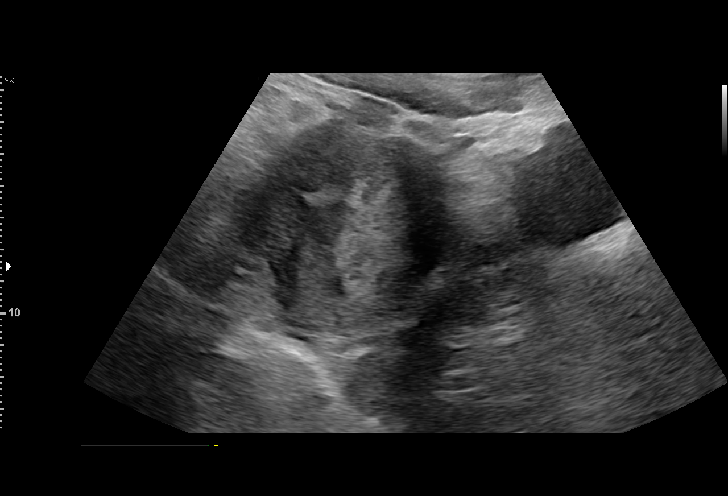
[im 3/32]
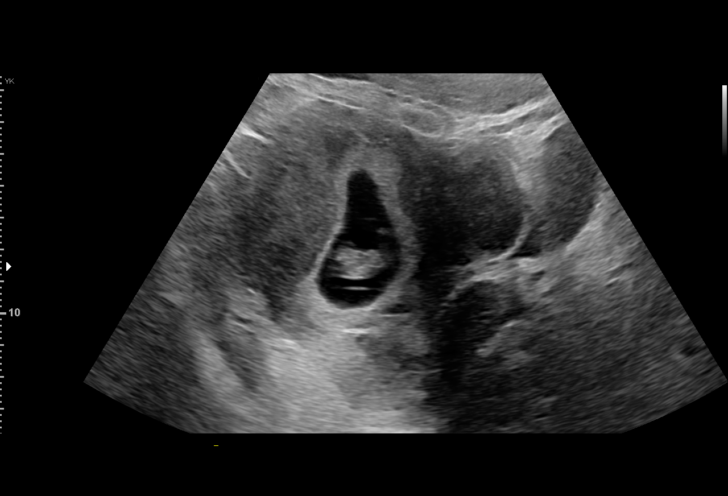
[im 5/32]
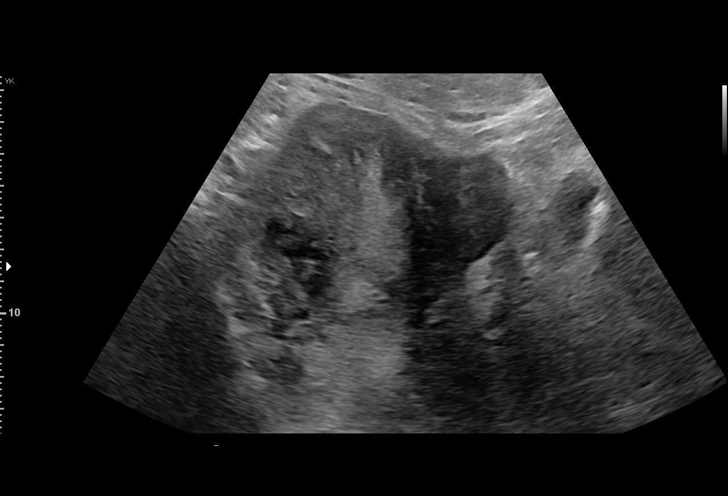
[im 7/32]
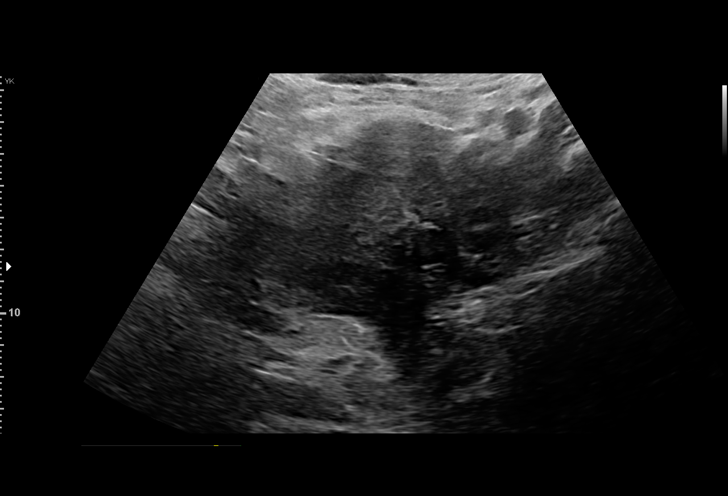
[im 10/32]
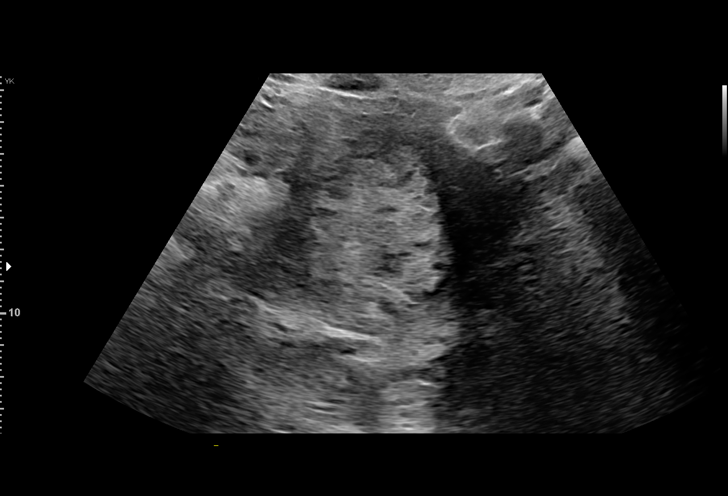
[im 12/32]
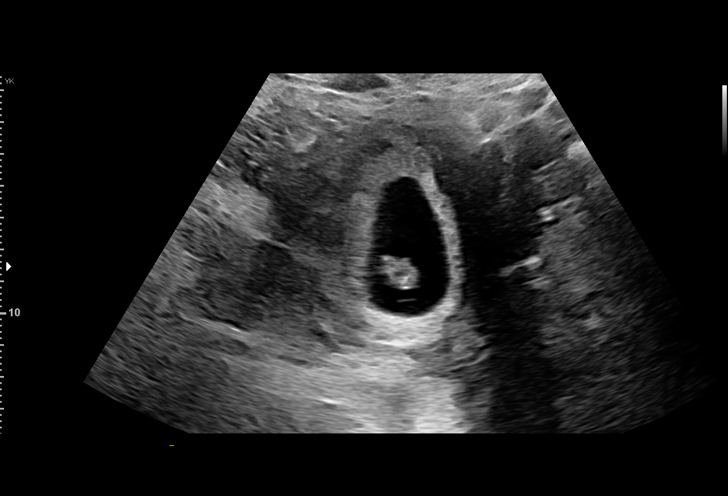
[im 14/32]
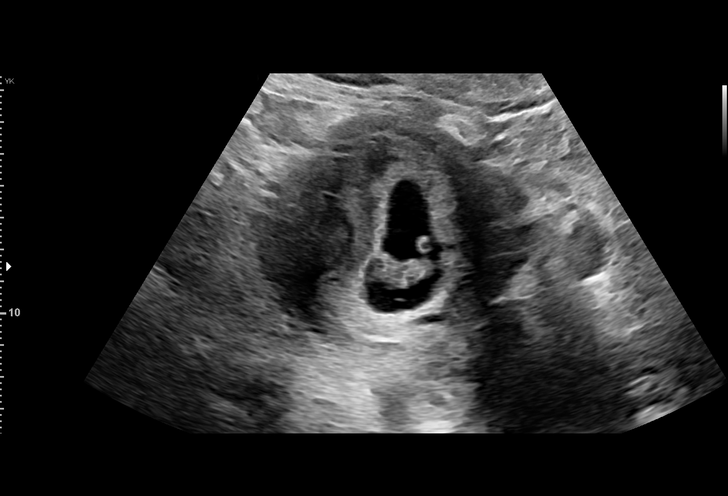
[im 17/32]
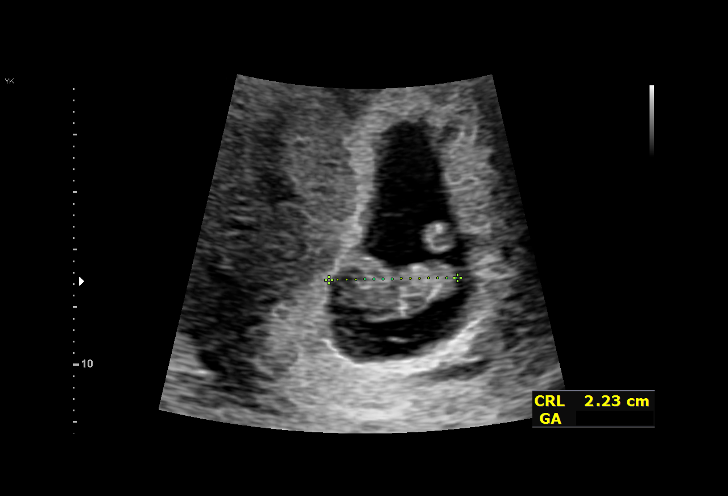
[im 18/32]
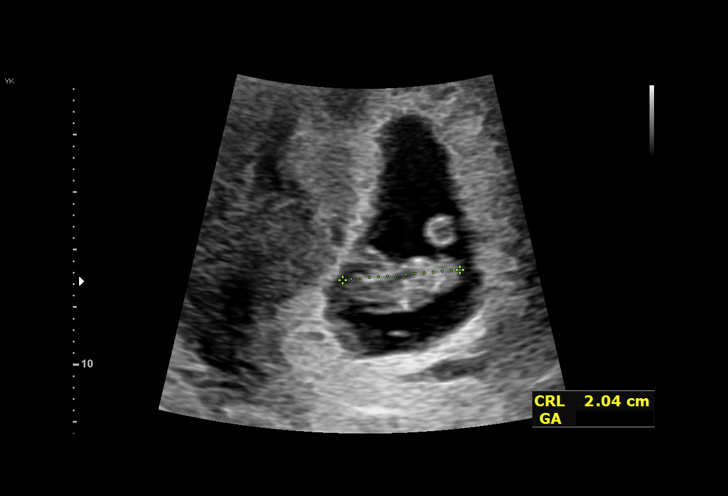
[im 20/32]
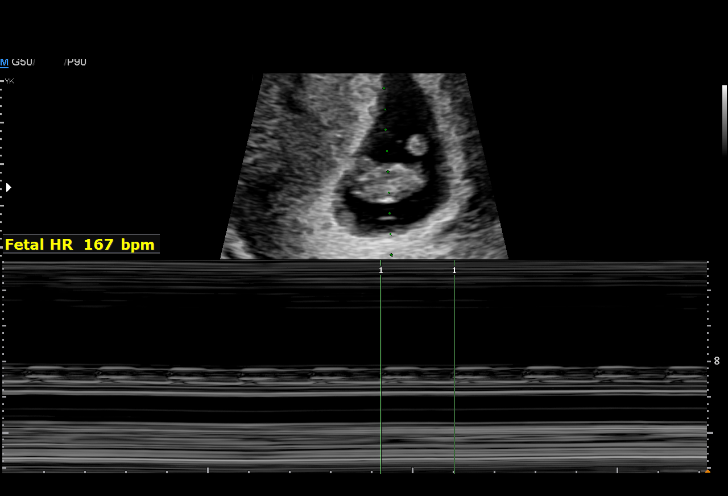
[im 22/32]
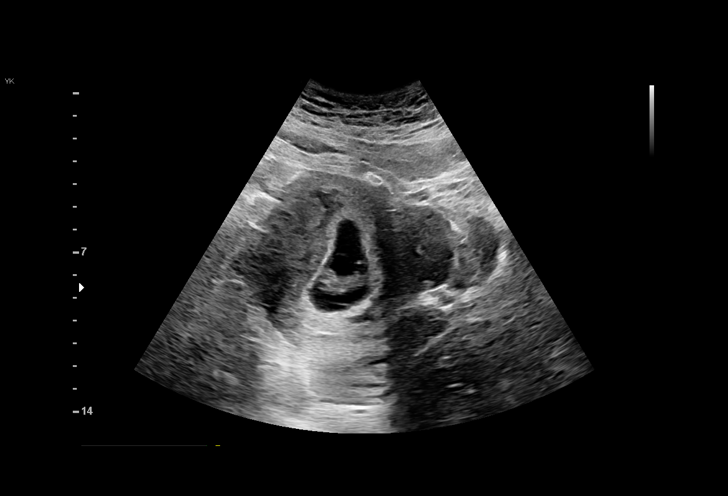
[im 25/32]
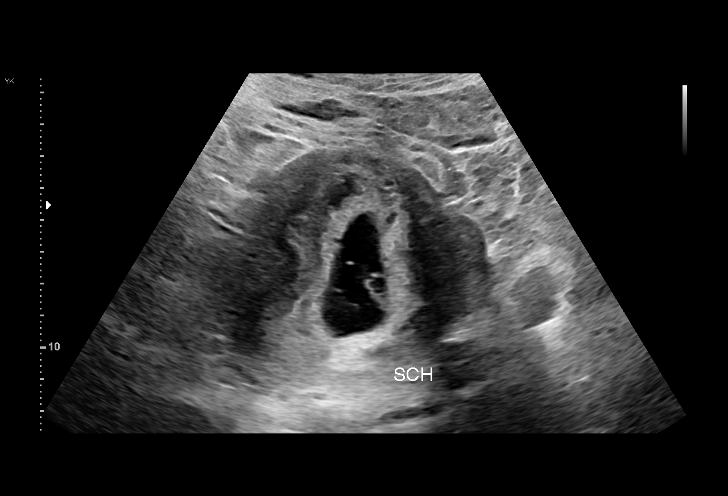
[im 27/32]
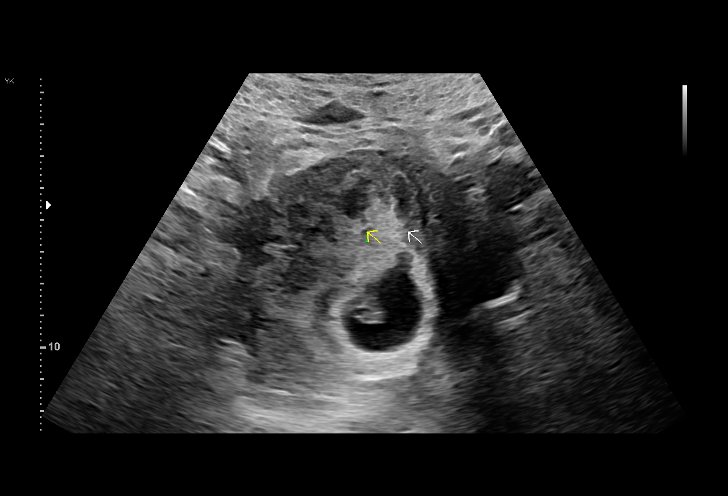
[im 29/32]
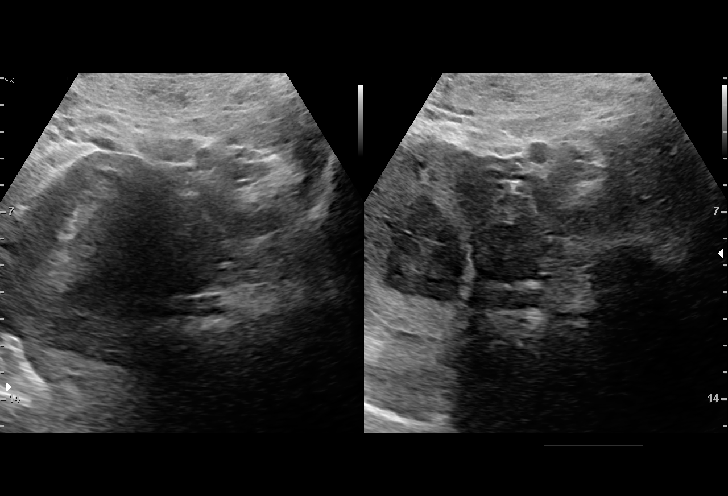
[im 32/32]
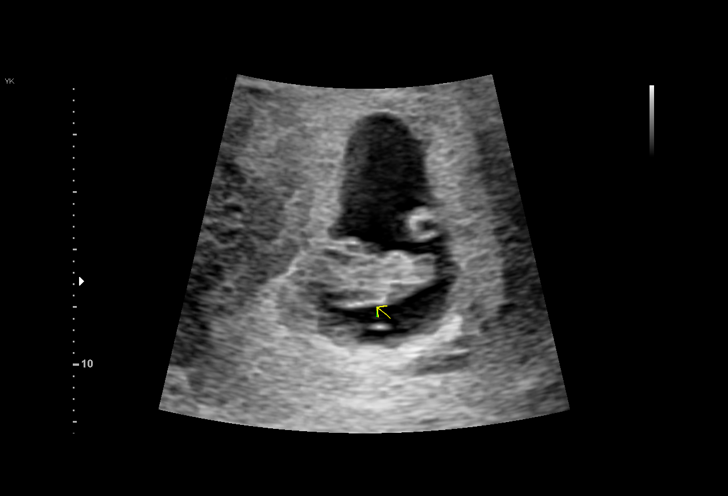

[15 of 28 positions shown; findings below may reference images not displayed]

FINDINGS: Intrauterine gestational sac: Single

Yolk sac:  Visualized.

Embryo:  Visualized.

Cardiac Activity: Visualized.

Heart Rate: 169 bpm

CRL: 21.4 mm   8 w 5 d                  US EDC: July 20, 2021

Subchorionic hemorrhage:  Small volume subchorionic hemorrhage.

Maternal uterus/adnexae: Unremarkable
IMPRESSION: Single viable intrauterine pregnancy with small volume subchorionic
hemorrhage.

## 2022-03-31 ENCOUNTER — Encounter (INDEPENDENT_AMBULATORY_CARE_PROVIDER_SITE_OTHER): Payer: Self-pay
